# Patient Record
Sex: Male | Born: 1950
Health system: Southern US, Community
[De-identification: ages and names within clinical notes are randomized; demographics above are authoritative.]

## PROBLEM LIST (undated history)

## (undated) DIAGNOSIS — I10 Essential (primary) hypertension: Secondary | ICD-10-CM

## (undated) DIAGNOSIS — K219 Gastro-esophageal reflux disease without esophagitis: Secondary | ICD-10-CM

## (undated) DIAGNOSIS — J9859 Other diseases of mediastinum, not elsewhere classified: Secondary | ICD-10-CM

## (undated) DIAGNOSIS — I499 Cardiac arrhythmia, unspecified: Secondary | ICD-10-CM

## (undated) DIAGNOSIS — E785 Hyperlipidemia, unspecified: Secondary | ICD-10-CM

## (undated) DIAGNOSIS — E039 Hypothyroidism, unspecified: Secondary | ICD-10-CM

## (undated) DIAGNOSIS — M1990 Unspecified osteoarthritis, unspecified site: Secondary | ICD-10-CM

## (undated) HISTORY — PX: INNER EAR SURGERY: SHX679

## (undated) HISTORY — PX: COLONOSCOPY: SHX174

## (undated) HISTORY — DX: Essential (primary) hypertension: I10

## (undated) HISTORY — DX: Other diseases of mediastinum, not elsewhere classified: J98.59

## (undated) HISTORY — DX: Hyperlipidemia, unspecified: E78.5

## (undated) HISTORY — PX: HERNIA REPAIR: SHX51

## (undated) HISTORY — DX: Hypothyroidism, unspecified: E03.9

---

## 1998-05-06 ENCOUNTER — Ambulatory Visit (HOSPITAL_COMMUNITY): Admission: RE | Admit: 1998-05-06 | Discharge: 1998-05-06 | Payer: Self-pay | Admitting: Family Medicine

## 2003-05-08 ENCOUNTER — Encounter: Payer: Self-pay | Admitting: *Deleted

## 2003-05-08 ENCOUNTER — Ambulatory Visit (HOSPITAL_COMMUNITY): Admission: RE | Admit: 2003-05-08 | Discharge: 2003-05-08 | Payer: Self-pay | Admitting: *Deleted

## 2003-05-28 ENCOUNTER — Ambulatory Visit (HOSPITAL_COMMUNITY): Admission: RE | Admit: 2003-05-28 | Discharge: 2003-05-28 | Payer: Self-pay | Admitting: *Deleted

## 2004-07-21 ENCOUNTER — Ambulatory Visit (HOSPITAL_COMMUNITY): Admission: RE | Admit: 2004-07-21 | Discharge: 2004-07-21 | Payer: Self-pay | Admitting: Family Medicine

## 2006-05-25 ENCOUNTER — Ambulatory Visit (HOSPITAL_BASED_OUTPATIENT_CLINIC_OR_DEPARTMENT_OTHER): Admission: RE | Admit: 2006-05-25 | Discharge: 2006-05-25 | Payer: Self-pay | Admitting: General Surgery

## 2009-07-12 ENCOUNTER — Ambulatory Visit: Payer: Self-pay | Admitting: Internal Medicine

## 2009-07-26 ENCOUNTER — Encounter: Payer: Self-pay | Admitting: Internal Medicine

## 2009-07-26 ENCOUNTER — Ambulatory Visit: Payer: Self-pay | Admitting: Internal Medicine

## 2009-07-29 ENCOUNTER — Encounter: Payer: Self-pay | Admitting: Internal Medicine

## 2011-01-01 NOTE — Miscellaneous (Signed)
Summary: [facility]  Clinical Lists Changes  Medications: Added new medication of MOVIPREP 100 GM  SOLR (PEG-KCL-NACL-NASULF-NA ASC-C) As per prep instructions. - Signed Rx of MOVIPREP 100 GM  SOLR (PEG-KCL-NACL-NASULF-NA ASC-C) As per prep instructions.;  #1 x 0;  Signed;  Entered by: Ezra Sites RN;  Authorized by: Hilarie Fredrickson MD;  Method used: Electronically to [facility] [id number], [place], [place], [place]  [address], Ph: [phone] or [phone], Fax: [phone] Observations: Added new observation of NKA: T (07/12/2009 16:05)    Prescriptions: MOVIPREP 100 GM  SOLR (PEG-KCL-NACL-NASULF-NA ASC-C) As per prep instructions.  #1 x 0   Entered by:   Ezra Sites RN   Authorized by:   Hilarie Fredrickson MD   Signed by:   Ezra Sites RN on 07/12/2009   Method used:   Electronically to        [facility] [address] [phone]* (retail)       [address]       [place], [place]  [address]       Ph: [phone] or [phone]       Fax: [phone]   RxID:   [id number]

## 2011-01-01 NOTE — Letter (Signed)
Summary: Patient Notice- Polyp Results  [facility]  [address] [place], [place] [address]   Phone: [phone]  Fax: [phone]        July 29, 2009 MRN: [id number]    [facility] [address] [place], [place]  [address]    Dear Mario Hill,  I am pleased to inform you that the colon polyp(s) removed during your recent colonoscopy was (were) found to be benign (no cancer detected) upon pathologic examination.  I recommend you have a repeat colonoscopy examination in 10 years to look for recurrent polyps, as having colon polyps increases your risk for having recurrent polyps or even colon cancer in the future.  Should you develop new or worsening symptoms of abdominal pain, bowel habit changes or bleeding from the rectum or bowels, please schedule an evaluation with either your primary care physician or with me.  Additional information/recommendations:  __ No further action with gastroenterology is needed at this time. Please      follow-up with your primary care physician for your other healthcare      needs.   Please call us if you are having persistent problems or have questions about your condition that have not been fully answered at this time.  Sincerely,  Hilarie Fredrickson MD  This letter has been electronically signed by your physician.

## 2011-01-01 NOTE — Procedures (Signed)
Summary: Colonoscopy   Colonoscopy  Procedure date:  07/26/2009  Findings:      Location:  [facility].    Procedures Next Due Date:    Colonoscopy: 08/2019 COLONOSCOPY PROCEDURE REPORT  PATIENT:  Mario Hill, Mario Hill  MR#:  [id number] BIRTHDATE:   [birth year], 60 yrs. old   GENDER:   male  ENDOSCOPIST:   Cato Liburd. Eda Keys, MD Referred by: Kari Baars, M.D.  PROCEDURE DATE:  07/26/2009 PROCEDURE:  Colonoscopy with snare polypectomy ASA CLASS:   Class II INDICATIONS: Routine Risk Screening   MEDICATIONS:    Fentanyl 75 mcg IV, Versed 7 mg IV  DESCRIPTION OF PROCEDURE:   After the risks benefits and alternatives of the procedure were thoroughly explained, informed consent was obtained.  Digital rectal exam was performed and revealed no abnormalities.   The LB CF-[id number] endoscope was introduced through the anus and advanced to the cecum, which was identified by both the appendix and ileocecal valve, without limitations.TIME TO CECUM = 1:53 MIN.  The quality of the prep was excellent, using MoviPrep.  The instrument was then withdrawn (TIME = 14:22  MIN.) as the colon was fully examined. <<PROCEDUREIMAGES>>          <<OLD IMAGES>>  FINDINGS:  A 3mm diminutive polyp was found in the rectum. Polyp was snared without cautery. Retrieval was successful.  Mild diverticulosis was found in the sigmoid colon.  This was otherwise a normal examination of the colon.   Retroflexed views in the rectum revealed internal hemorrhoids.    The scope was then withdrawn from the patient and the procedure completed.  COMPLICATIONS:   None  ENDOSCOPIC IMPRESSION:  1) Diminutive polyp in the rectum -Removed  2) Mild diverticulosis in the sigmoid colon  3) Otherwise normal examination  4) Internal hemorrhoids   RECOMMENDATIONS:  1) Repeat colonoscopy in 5 years if polyp adenomatous; otherwise 10 years    _______________________________ Wilhemina Bonito. Eda Keys, MD  CC: Eric Form,  MD;  The Patient     REPORT OF SURGICAL PATHOLOGY   Case #: [id number] Patient Name: RAUN, ROUTH. Office Chart Number:  N/A [id number] MRN: [id number] Pathologist: Ferd Hibbs. Colonel Bald, MD DOB/Age  02-12-1951 (Age: 26)    Gender: M Date Taken:  07/26/2009 Date Received: 07/26/2009   FINAL DIAGNOSIS   ***MICROSCOPIC EXAMINATION AND DIAGNOSIS***   RECTUM, BIOPSY:   -  HYPERPLASTIC POLYP. -  THERE IS NO EVIDENCE OF MALIGNANCY.   kv Date Reported:  07/29/2009     Ivin Booty B. Colonel Bald, MD *** Electronically Signed Out By [facility] ***   July 29, 2009 MRN: [id number]    [facility] [address] [place], [place]  [address]    Dear Mr. MOLDEN,  I am pleased to inform you that the colon polyp(s) removed during your recent colonoscopy was (were) found to be benign (no cancer detected) upon pathologic examination.  I recommend you have a repeat colonoscopy examination in 10 years to look for recurrent polyps, as having colon polyps increases your risk for having recurrent polyps or even colon cancer in the future.  Should you develop new or worsening symptoms of abdominal pain, bowel habit changes or bleeding from the rectum or bowels, please schedule an evaluation with either your primary care physician or with me.  Additional information/recommendations:  __ No further action with gastroenterology is needed at this time. Please      follow-up with your primary care physician for your other healthcare  needs.   Please call us if you are having persistent problems or have questions about your condition that have not been fully answered at this time.  Sincerely,  Hilarie Fredrickson MD  This letter has been electronically signed by your physician.   Signed by Hilarie Fredrickson MD on 07/29/2009 at 4:37 PM  This report was created from the original endoscopy report, which was reviewed and signed by the above listed endoscopist.

## 2011-02-01 ENCOUNTER — Inpatient Hospital Stay (INDEPENDENT_AMBULATORY_CARE_PROVIDER_SITE_OTHER)
Admission: RE | Admit: 2011-02-01 | Discharge: 2011-02-01 | Disposition: A | Discharge status: Home / Self Care | Payer: Managed Care, Other (non HMO) | Source: Ambulatory Visit | Attending: Emergency Medicine | Admitting: Emergency Medicine

## 2011-02-01 DIAGNOSIS — W540XXA Bitten by dog, initial encounter: Secondary | ICD-10-CM

## 2011-02-01 DIAGNOSIS — S61409A Unspecified open wound of unspecified hand, initial encounter: Secondary | ICD-10-CM

## 2011-02-01 DIAGNOSIS — Z23 Encounter for immunization: Secondary | ICD-10-CM

## 2011-02-03 ENCOUNTER — Inpatient Hospital Stay (HOSPITAL_COMMUNITY)
Admission: RE | Admit: 2011-02-03 | Discharge: 2011-02-03 | Disposition: A | Discharge status: Home / Self Care | Payer: Managed Care, Other (non HMO) | Source: Ambulatory Visit | Attending: Family Medicine | Admitting: Family Medicine

## 2011-02-11 ENCOUNTER — Inpatient Hospital Stay (HOSPITAL_COMMUNITY)
Admission: RE | Admit: 2011-02-11 | Discharge: 2011-02-11 | Disposition: A | Discharge status: Home / Self Care | Payer: Managed Care, Other (non HMO) | Source: Ambulatory Visit | Attending: Family Medicine | Admitting: Family Medicine

## 2011-04-17 NOTE — Op Note (Signed)
NAME:  PSALM, SCHAPPELL NO.:  [id number]   MEDICAL RECORD NO.:  [id number]          PATIENT TYPE:  AMB   LOCATION:  [facility]                          FACILITY:  [facility]   PHYSICIAN:  Gabrielle Dare. Janee Morn, M.D.DATE OF BIRTH:  [birth year]   DATE OF PROCEDURE:  05/25/2006  DATE OF DISCHARGE:                                 OPERATIVE REPORT   PREOPERATIVE DIAGNOSIS:  Left inguinal hernia.   POSTOPERATIVE DIAGNOSIS:  Left inguinal hernia.   PROCEDURE:  Repair of left inguinal hernia with mesh.   SURGEON:  Gabrielle Dare. Janee Morn, M.D.   ANESTHESIA:  General.   INDICATIONS FOR PROCEDURE:  The patient is a 60 year old gentleman who I saw  in the office for a symptomatic left inguinal hernia.  Of note, he is status  post right inguinal hernia repair with mesh approximately 10 years ago.  He  presents today for elective left inguinal hernia repair with mesh.   DESCRIPTION OF PROCEDURE:  Informed consent was obtained.  The patient's  site was marked.  He received intravenous antibiotics.  He was brought to  the operating room, and general anesthesia was administered.  His left groin  and abdomen were prepped and draped in a sterile fashion. Marcaine 0.25%  with epinephrine was injected in the left inguinal region and also up  towards the anterior superior iliac spine for some postoperative pain relief  nerve block.   A left inguinal incision was made.  The subcutaneous tissues were dissected  down through Scarpa's fascia, revealing the external oblique fascia.  This  was divided sharply down through the external ring.  The superior leaflet of  the external oblique was dissected off of the transversalis.  The inferior  leaflet was dissected bluntly from the cord structures, revealing the  shelving edge of the inguinal ligament.  The cord structures were encircled  with a Penrose drain.  The inguinal floor was inspected.  There was no  evidence of direct hernia.  The cord was then  dissected, revealing a  moderately sized indirect hernia sac.  This was dissected free from the cord  structures, and then the sac was opened.  It contained omentum only.  The  omentum was reduced back into the abdomen, and the sac was high-ligated with  a 2-0 Vicryl suture and removed.  Completion of the repair was then done  with a polypropylene mesh that was fashioned into a keyhole shape using our  standard template.  The mesh was sutured to the tissues over the pubic  tubercle medially with a 2-0 Prolene and then sutured in a running fashion  along the shelving edge of the inguinal ligament inferiorly with a 2-0  Prolene superiorly and over the inguinal ligament.  The mesh was again  tacked in an interrupted fashion with several 2-0 Prolene sutures.  This  continued along, tacking the mesh to the transversalis with interrupted 2-0  Prolene sutures.  The two leaflets of the mesh were rejoined behind the cord  and tacked together and to the underlying musculature with several  interrupted 2-0 Prolene sutures.  The aperture for the cord through the mesh  admitted the tip of a fifth digit, and the cord was viable and not  edematous.  At this time, the wound was copiously irrigated.  Meticulous  hemostasis was insured.  Some additional local anesthetic was injected.  We  then closed the external oblique, being careful again to protect the  ilioinguinal nerve, using running 2-0 Vicryl suture.  The subcutaneous  tissues were irrigated.  Hemostasis was again insured, and Scarpa's fascia  was approximated with interrupted 3-0 Vicryl sutures, and the skin was  closed with a running 4-0 Monocryl subcuticular stitch.  Sponge, needle, and  instrument counts were correct.  Benzoin, Steri-Strips, and sterile  dressings were applied.  The patient's left testicle was returned to normal  position in his scrotum, and this completed the procedure.   He tolerated the procedure well without apparent  complication and was taken  to the recovery room in stable condition.      Gabrielle Dare Janee Morn, M.D.  Electronically Signed     BET/MEDQ  D:  05/25/2006  T:  05/25/2006  Job:  [id number]   cc:   Martha Clan, M.D.

## 2014-08-10 ENCOUNTER — Encounter: Payer: Self-pay | Admitting: Internal Medicine

## 2014-10-19 ENCOUNTER — Other Ambulatory Visit: Payer: Self-pay | Admitting: Otolaryngology

## 2015-10-14 ENCOUNTER — Ambulatory Visit (INDEPENDENT_AMBULATORY_CARE_PROVIDER_SITE_OTHER): Payer: BLUE CROSS/BLUE SHIELD | Admitting: Licensed Clinical Social Worker

## 2015-10-14 DIAGNOSIS — F4320 Adjustment disorder, unspecified: Secondary | ICD-10-CM | POA: Diagnosis not present

## 2015-10-29 ENCOUNTER — Ambulatory Visit (INDEPENDENT_AMBULATORY_CARE_PROVIDER_SITE_OTHER): Payer: BLUE CROSS/BLUE SHIELD | Admitting: Licensed Clinical Social Worker

## 2015-10-29 DIAGNOSIS — F4320 Adjustment disorder, unspecified: Secondary | ICD-10-CM

## 2016-05-12 ENCOUNTER — Ambulatory Visit (INDEPENDENT_AMBULATORY_CARE_PROVIDER_SITE_OTHER): Payer: BLUE CROSS/BLUE SHIELD | Admitting: Licensed Clinical Social Worker

## 2016-05-12 DIAGNOSIS — F329 Major depressive disorder, single episode, unspecified: Secondary | ICD-10-CM

## 2016-07-27 ENCOUNTER — Ambulatory Visit (INDEPENDENT_AMBULATORY_CARE_PROVIDER_SITE_OTHER): Payer: BLUE CROSS/BLUE SHIELD | Admitting: Licensed Clinical Social Worker

## 2016-07-27 DIAGNOSIS — F329 Major depressive disorder, single episode, unspecified: Secondary | ICD-10-CM | POA: Diagnosis not present

## 2016-09-21 ENCOUNTER — Ambulatory Visit: Payer: BLUE CROSS/BLUE SHIELD | Admitting: Licensed Clinical Social Worker

## 2017-08-04 DIAGNOSIS — I10 Essential (primary) hypertension: Secondary | ICD-10-CM | POA: Diagnosis not present

## 2017-08-04 DIAGNOSIS — R8299 Other abnormal findings in urine: Secondary | ICD-10-CM | POA: Diagnosis not present

## 2017-08-04 DIAGNOSIS — Z125 Encounter for screening for malignant neoplasm of prostate: Secondary | ICD-10-CM | POA: Diagnosis not present

## 2017-08-04 DIAGNOSIS — E785 Hyperlipidemia, unspecified: Secondary | ICD-10-CM | POA: Diagnosis not present

## 2017-08-04 DIAGNOSIS — R7301 Impaired fasting glucose: Secondary | ICD-10-CM | POA: Diagnosis not present

## 2017-08-04 DIAGNOSIS — E038 Other specified hypothyroidism: Secondary | ICD-10-CM | POA: Diagnosis not present

## 2017-08-09 ENCOUNTER — Other Ambulatory Visit: Payer: Self-pay | Admitting: Internal Medicine

## 2017-08-09 DIAGNOSIS — Z0000 Encounter for general adult medical examination without abnormal findings: Secondary | ICD-10-CM | POA: Diagnosis not present

## 2017-08-09 DIAGNOSIS — E669 Obesity, unspecified: Secondary | ICD-10-CM | POA: Diagnosis not present

## 2017-08-09 DIAGNOSIS — F17201 Nicotine dependence, unspecified, in remission: Secondary | ICD-10-CM

## 2017-08-09 DIAGNOSIS — K219 Gastro-esophageal reflux disease without esophagitis: Secondary | ICD-10-CM | POA: Diagnosis not present

## 2017-08-09 DIAGNOSIS — Z136 Encounter for screening for cardiovascular disorders: Secondary | ICD-10-CM

## 2017-08-09 DIAGNOSIS — Z6832 Body mass index (BMI) 32.0-32.9, adult: Secondary | ICD-10-CM | POA: Diagnosis not present

## 2017-08-09 DIAGNOSIS — E038 Other specified hypothyroidism: Secondary | ICD-10-CM | POA: Diagnosis not present

## 2017-08-09 DIAGNOSIS — R69 Illness, unspecified: Secondary | ICD-10-CM | POA: Diagnosis not present

## 2017-08-09 DIAGNOSIS — R7301 Impaired fasting glucose: Secondary | ICD-10-CM | POA: Diagnosis not present

## 2017-08-09 DIAGNOSIS — I10 Essential (primary) hypertension: Secondary | ICD-10-CM | POA: Diagnosis not present

## 2017-08-09 DIAGNOSIS — E784 Other hyperlipidemia: Secondary | ICD-10-CM | POA: Diagnosis not present

## 2017-08-09 DIAGNOSIS — R740 Nonspecific elevation of levels of transaminase and lactic acid dehydrogenase [LDH]: Secondary | ICD-10-CM | POA: Diagnosis not present

## 2017-08-13 DIAGNOSIS — Z1212 Encounter for screening for malignant neoplasm of rectum: Secondary | ICD-10-CM | POA: Diagnosis not present

## 2017-08-20 ENCOUNTER — Ambulatory Visit
Admission: RE | Admit: 2017-08-20 | Discharge: 2017-08-20 | Disposition: A | Discharge status: Home / Self Care | Payer: Self-pay | Source: Ambulatory Visit | Attending: Internal Medicine | Admitting: Internal Medicine

## 2017-08-20 DIAGNOSIS — Z136 Encounter for screening for cardiovascular disorders: Secondary | ICD-10-CM | POA: Diagnosis not present

## 2017-08-20 DIAGNOSIS — Z87891 Personal history of nicotine dependence: Secondary | ICD-10-CM | POA: Diagnosis not present

## 2017-08-20 DIAGNOSIS — R69 Illness, unspecified: Secondary | ICD-10-CM | POA: Diagnosis not present

## 2017-08-20 DIAGNOSIS — F17201 Nicotine dependence, unspecified, in remission: Secondary | ICD-10-CM

## 2017-08-25 ENCOUNTER — Institutional Professional Consult (permissible substitution) (INDEPENDENT_AMBULATORY_CARE_PROVIDER_SITE_OTHER): Payer: Medicare HMO | Admitting: Thoracic Surgery (Cardiothoracic Vascular Surgery)

## 2017-08-25 ENCOUNTER — Encounter: Payer: Self-pay | Admitting: Thoracic Surgery (Cardiothoracic Vascular Surgery)

## 2017-08-25 VITALS — BP 134/84 | HR 77 | Resp 18 | Ht 68.0 in | Wt 220.0 lb

## 2017-08-25 DIAGNOSIS — E785 Hyperlipidemia, unspecified: Secondary | ICD-10-CM | POA: Diagnosis not present

## 2017-08-25 DIAGNOSIS — M1990 Unspecified osteoarthritis, unspecified site: Secondary | ICD-10-CM

## 2017-08-25 DIAGNOSIS — K219 Gastro-esophageal reflux disease without esophagitis: Secondary | ICD-10-CM

## 2017-08-25 DIAGNOSIS — I10 Essential (primary) hypertension: Secondary | ICD-10-CM | POA: Diagnosis not present

## 2017-08-25 DIAGNOSIS — J9859 Other diseases of mediastinum, not elsewhere classified: Secondary | ICD-10-CM

## 2017-08-25 NOTE — Progress Notes (Signed)
PCP is Marton Redwood, MD Referring Provider is Marton Redwood, MD  Chief Complaint  Patient presents with  . Mediastinal Mass    eval for mediastinal mass, CT chest done 9/21    HPI: Mario Hill is sent for consultation re: an anterior mediastinal mass  Mario Hill is a 66 year old gentleman with a past history of tobacco abuse (quit in 2006), hypertension, hyperlipidemia, hypothyroidism. He recently saw Dr. Brigitte Pulse. He met criteria for low dose CT screening for lung cancer. He had a low-dose scan last week and it showed an anterior mediastinal mass.  Sometimes gets short of breath and fatigue with heavy exertion but not with normal activities. He denies any chest pain, pressure, or tightness. He has no orthopnea, paroxysmal nocturnal dyspnea, or peripheral edema. His appetite is good and his weight is stable. He denies double vision and difficulty swallowing.  Zubrod Score: At the time of surgery this patient's most appropriate activity status/level should be described as: '[]'     0    Normal activity, no symptoms '[]'     1    Restricted in physical strenuous activity but ambulatory, able to do out light work '[]'     2    Ambulatory and capable of self care, unable to do work activities, up and about >50 % of waking hours                              '[]'     3    Only limited self care, in bed greater than 50% of waking hours '[]'     4    Completely disabled, no self care, confined to bed or chair '[]'     5    Moribund  PAST MEDICAL HISTORY  Hypertension Hyperlipidemia Hypothyroidism Tobacco abuse (quit 2006)  PAST SURGICAL HISTORY   Ear surgery Bilateral hernia repairs   FAMILY HISTORY  Father- lung or kidney cancer Mother- heart disease Grandfather and grandmother- heart disease  Social History Social History  Substance Use Topics  . Smoking status: Former Smoker    Packs/day: 1.00    Types: Cigarettes    Quit date: 11/30/2004  . Smokeless tobacco: Never Used  . Alcohol use Yes     Current Outpatient Prescriptions  Medication Sig Dispense Refill  . atorvastatin (LIPITOR) 40 MG tablet Take 40 mg by mouth at bedtime.  3  . levothyroxine (SYNTHROID, LEVOTHROID) 50 MCG tablet Take 50 mcg by mouth daily.  6  . lisinopril (PRINIVIL,ZESTRIL) 20 MG tablet Take 20 mg by mouth daily.  6  . omeprazole (PRILOSEC) 20 MG capsule Take 20 mg by mouth daily.  3  . sertraline (ZOLOFT) 50 MG tablet Take 50 mg by mouth daily.  5   No current facility-administered medications for this visit.     Not on File  Review of Systems  Constitutional: Negative for activity change, appetite change, fatigue, fever and unexpected weight change.  HENT: Negative for trouble swallowing and voice change.   Eyes: Negative for visual disturbance.  Respiratory: Positive for shortness of breath (With heavy exertion). Negative for cough and wheezing.   Gastrointestinal: Positive for abdominal pain (Reflux). Negative for constipation and diarrhea.  Genitourinary: Negative for difficulty urinating and dysuria.  Musculoskeletal: Positive for arthralgias and joint swelling.  Neurological: Negative for dizziness, facial asymmetry and headaches.  Hematological: Negative for adenopathy. Does not bruise/bleed easily.  All other systems reviewed and are negative.   BP 134/84 (BP Location:  Right Arm, Cuff Size: Normal)   Pulse 77   Resp 18   Ht '5\' 8"'  (1.727 m)   Wt 220 lb (99.8 kg)   SpO2 92% Comment: on RA  BMI 33.45 kg/m  Physical Exam  Constitutional: He is oriented to person, place, and time. He appears well-developed and well-nourished. No distress.  HENT:  Head: Normocephalic and atraumatic.  Mouth/Throat: No oropharyngeal exudate.  Eyes: Pupils are equal, round, and reactive to light. Conjunctivae and EOM are normal. No scleral icterus.  Neck: Neck supple. No thyromegaly present.  Cardiovascular: Normal rate, regular rhythm, normal heart sounds and intact distal pulses.  Exam reveals no  gallop and no friction rub.   No murmur heard. Pulmonary/Chest: Effort normal and breath sounds normal. No respiratory distress. He has no wheezes. He has no rales.  Abdominal: Soft. He exhibits no distension. There is no tenderness.  Musculoskeletal: He exhibits no edema or deformity.  Lymphadenopathy:    He has no cervical adenopathy.  Neurological: He is alert and oriented to person, place, and time. No cranial nerve deficit.  Motor function grossly intact  Skin: Skin is warm and dry.  Vitals reviewed.    Diagnostic Tests: CT CHEST WITHOUT CONTRAST LOW-DOSE FOR LUNG CANCER SCREENING  TECHNIQUE: Multidetector CT imaging of the chest was performed following the standard protocol without IV contrast.  COMPARISON:  None.  FINDINGS: Cardiovascular: The heart is normal in size. No pericardial effusion.  No evidence of thoracic aortic aneurysm. Mild atherosclerotic calcifications of the aortic arch.  Three vessel coronary atherosclerosis.  Mediastinum/Nodes: 2.3 x 1.9 x 2.9 cm triangular prevascular soft tissue lesion (series 2/ image 22), favoring a thymoma, less likely an enlarged prevascular lymph node.  Otherwise, no suspicious mediastinal lymphadenopathy.  Visualized thyroid is unremarkable.  Lungs/Pleura: Mild centrilobular emphysematous changes, upper lobe predominant.  No focal consolidation.  2.6 mm subpleural granuloma in the posterior right lower lobe, benign.  No pleural effusion or pneumothorax.  Upper Abdomen: Visualized upper abdomen is notable for mild hepatic steatosis.  Musculoskeletal: Mild degenerative changes of the visualized thoracolumbar spine.  IMPRESSION: Lung-RADS 1S, negative. Continue annual screening with low-dose chest CT without contrast in 12 months.  A Lung-RADS "S" modifier is used due to the presence of a potentially clinically significant finding, in this case, 2.9 cm triangular prevascular soft tissue  lesion, favoring a thymoma, less likely an enlarged prevascular lymph node. Thoracic surgery consultation is suggested.  Aortic Atherosclerosis (ICD10-I70.0) and Emphysema (ICD10-J43.9).   Electronically Signed   By: Julian Hy M.D.   On: 08/20/2017 13:24 I personally reviewed the CT images and concur with the findings noted above  Impression: Mario Hill is a 66 year old man with a past history of tobacco abuse, hypertension, hyperlipidemia. He recently had a low-dose screening CT scan. It showed no evidence of lung cancer but did show an anterior mediastinal mass that officially measures 2.3 x 1.9 x 2.9 cm. (On the coronal views I measured it at 4 cm). Differential diagnosis includes thymoma, lymphoma, germ cell tumor, and ectopic thyroid. Of these thymoma is the most likely. The mass appears relatively well encapsulated and surrounded by fatty tissue, there is no evidence of invasion of surrounding structures. He does not have any myasthenia symptoms.  I discussed the options of surgical resection versus radiographic follow-up with Mario Hill. I would favor surgical resection given the size of this lesion. We discussed approaching the lesion through a sternotomy or via a video-assisted thoracoscopic approach. This mass  does appear to be approachable via a VATS.  I described the general nature of the proposed operation which is right VATS for resection of anterior mediastinal mass. He understands there is a possibility we could have to go into the left chest as well. I informed them of the indications, risks, benefits, and alternatives. They understand the risks include, but are not limited to death, MI, DVT, PE, bleeding, possible need for transfusion, infection, phrenic nerve injury leading to paralysis of the diaphragm, as well as the possibility of other unforeseeable complications.  He is in favor of surgical resection, but asked if he could wait until after the first of the  year. There is no urgency, but I am a little concerned about waiting that long. He would need another CT scan if he decides to wait until after the first of the year as it would be more than 3 months since his previous scan.  Coronary atherosclerosis- noted on CT. I discussed this finding with Mario Hill. They understand this is a common finding in people in his age group. He does not have any anginal symptoms. He is on a statin.  Aortic atherosclerosis- also noted on CT scan. Appears mild.  Plan: Right VATS for resection of anterior mediastinal mass with possible left VATS.  Patient will call to schedule surgery.  Melrose Nakayama, MD [facility] [phone]

## 2017-12-06 ENCOUNTER — Other Ambulatory Visit: Payer: Self-pay | Admitting: Thoracic Surgery (Cardiothoracic Vascular Surgery)

## 2017-12-06 DIAGNOSIS — J9859 Other diseases of mediastinum, not elsewhere classified: Secondary | ICD-10-CM

## 2018-01-04 ENCOUNTER — Ambulatory Visit
Admission: RE | Admit: 2018-01-04 | Discharge: 2018-01-04 | Disposition: A | Discharge status: Home / Self Care | Payer: PPO | Source: Ambulatory Visit | Attending: Thoracic Surgery (Cardiothoracic Vascular Surgery) | Admitting: Thoracic Surgery (Cardiothoracic Vascular Surgery)

## 2018-01-04 ENCOUNTER — Other Ambulatory Visit: Payer: Self-pay

## 2018-01-04 ENCOUNTER — Ambulatory Visit: Payer: Medicare HMO | Admitting: Thoracic Surgery (Cardiothoracic Vascular Surgery)

## 2018-01-04 DIAGNOSIS — J9859 Other diseases of mediastinum, not elsewhere classified: Secondary | ICD-10-CM

## 2018-01-04 DIAGNOSIS — R911 Solitary pulmonary nodule: Secondary | ICD-10-CM | POA: Diagnosis not present

## 2018-01-07 ENCOUNTER — Other Ambulatory Visit: Payer: Self-pay

## 2018-01-07 ENCOUNTER — Ambulatory Visit: Payer: PPO | Admitting: Thoracic Surgery (Cardiothoracic Vascular Surgery)

## 2018-01-07 ENCOUNTER — Encounter: Payer: Self-pay | Admitting: Thoracic Surgery (Cardiothoracic Vascular Surgery)

## 2018-01-07 DIAGNOSIS — J9859 Other diseases of mediastinum, not elsewhere classified: Secondary | ICD-10-CM

## 2018-01-08 ENCOUNTER — Encounter: Payer: Self-pay | Admitting: Thoracic Surgery (Cardiothoracic Vascular Surgery)

## 2018-01-08 DIAGNOSIS — J9859 Other diseases of mediastinum, not elsewhere classified: Secondary | ICD-10-CM | POA: Insufficient documentation

## 2018-01-08 NOTE — Progress Notes (Signed)
[address]       [place] [id number]             [phone]       HPI: Mr. Mario Hill returns to discuss treatment of his anterior mediastinal mass  Mr. Carmie KannerBotts is a 67 year old man with a past history of tobacco abuse, hypertension, hyperlipidemia, hypothyroidism, and coronary and aortic atherosclerosis on CT.  He had a low-dose CT scan for screening for lung cancer last fall.  He was found to have an anterior mediastinal mass on that scan.  I saw him in September and recommended thymectomy.  Personal reasons he wanted to wait until after the first of the year to have surgery.  I recommended that we do another CT given the delay.  He has not had any new issues in the interim since his last visit.  He gets short of breath with heavy exertion but not with any type of routine activities.  He is not having any chest pain, pressure, or tightness.  He does not have any double vision or weakness.  Past medical history Tobacco abuse-quit 2006 Hypertension Hyperlipidemia Hypothyroidism Atherosclerotic cardiovascular disease    Current Outpatient Medications  Medication Sig Dispense Refill  . atorvastatin (LIPITOR) 40 MG tablet Take 40 mg by mouth at bedtime.  3  . levothyroxine (SYNTHROID, LEVOTHROID) 50 MCG tablet Take 50 mcg by mouth daily.  6  . lisinopril (PRINIVIL,ZESTRIL) 20 MG tablet Take 20 mg by mouth daily.  6  . omeprazole (PRILOSEC) 20 MG capsule Take 20 mg by mouth daily.  3  . sertraline (ZOLOFT) 50 MG tablet Take 50 mg by mouth daily.  5   No current facility-administered medications for this visit.     Physical Exam BP 128/79 (BP Location: Left Arm, Patient Position: Sitting, Cuff Size: Large)   Pulse 72   Resp 18   Ht 5\' 8"  (1.727 m)   Wt 219 lb 12.8 oz (99.7 kg)   SpO2 98% Comment: RA  BMI 33.8042 kg/m  67 year old man in no acute distress Alert and oriented x3 with no focal deficits Good strength in all 4 extremities HEENT unremarkable Neck  supple without thyromegaly or adenopathy Lungs clear with equal breath sounds bilaterally Cardiac regular rate and rhythm normal S1 and S2 Abdomen soft and nontender Extremities no clubbing cyanosis or edema  Diagnostic Tests: CT CHEST WITHOUT CONTRAST  TECHNIQUE: Multidetector CT imaging of the chest was performed following the standard protocol without IV contrast.  COMPARISON:  08/20/2017  FINDINGS: Cardiovascular: No acute findings. Aortic and coronary artery atherosclerosis.  Mediastinum/Nodes: An anterior mediastinal mass measures 2.2 x 1.8 cm, without significant change compared to previous study. This mass shows no evidence of internal fat or calcification. No other soft tissue masses or lymphadenopathy identified.  Lungs/Pleura: Mild centrilobular emphysema noted. No pulmonary infiltrate or mass identified. No effusion present.  Upper Abdomen:  Unremarkable.  Musculoskeletal:  No suspicious bone lesions.  IMPRESSION: Stable 2.2 cm nonspecific anterior mediastinal mass. This is suspicious for thymoma, with differential diagnosis including lymphoma and less likely germ cell tumor.  Mild centrilobular emphysema.  No active lung disease.  Aortic Atherosclerosis (ICD10-I70.0). Coronary artery calcification.   Electronically Signed   By: Myles RosenthalJohn  Stahl M.D.   On: 01/04/2018 12:48 I personally reviewed the CT images and concur with the findings noted above.  Impression: Mr. Mario Hill is a 67 year old gentleman with a small stable anterior mediastinal mass.  This is most likely a  thymoma, but germ cell tumors and lymphoma are also in the differential.  This is been relatively stable over time.  I still think he would be best served with a thymectomy is over time this could continue to grow and potentially invade surrounding structures.  Continued radiographic follow-up is an option but I would not favor that.  I reviewed the CT images with Mr. Mrs. Arko.  We  discussed the differential diagnosis.  We talked about surgical approaches including partial sternotomy and VATS.  He strongly prefers VATS.  He does understand there is a possibility that with a right VATS approach we may have to go into the left chest as well.  We reviewed the general nature of the operation including the need for general anesthesia and the incisions to be used.  I informed him of the indications, risks, benefits, and alternatives.  He understands the risks include but are not limited to death, MI, DVT, PE, bleeding possible need for transfusion, infection, phrenic or recurrent nerve injury, as well as possibility of other unforeseeable complications.  He wishes with surgical resection in the next 1-2 months, but has some family issues that need considered.  He will call to schedule surgery.  Plan: Right VATS, possible left VATS for thymectomy.  Patient will call to schedule  Loreli Slot, MD [facility] [phone]

## 2018-02-17 ENCOUNTER — Other Ambulatory Visit: Payer: Self-pay | Admitting: *Deleted

## 2018-02-17 DIAGNOSIS — J9859 Other diseases of mediastinum, not elsewhere classified: Secondary | ICD-10-CM

## 2018-03-11 NOTE — Pre-Procedure Instructions (Signed)
Edward JollyJohn S Koors  03/11/2018      [id number] [place] - [address] [facility] [id number] [place] [id number] Phone: [phone] Fax: [phone]    Your procedure is scheduled on April 17.  [facility] at [phone]   Call this number if you have problems the morning of surgery:  [phone]   Remember:  Do not eat food or drink liquids after midnight.  Take these medicines the morning of surgery with A SIP OF WATER Tylenol if needed, levothyroxine (Synthroid), Omeprazole (Prilosec), Sertraline (Zoloft)   Stop taking aspirin, BC's, Goody's, Herbal medications, Fish Oil, Aleve, Ibuprofen, Advil, Motrin, Vitamins   Do not wear jewelry, make-up or nail polish.  Do not wear lotions, powders, or perfumes, or deodorant.  Do not shave 48 hours prior to surgery.  Men may shave face and neck.  Do not bring valuables to the hospital.  [facility] is not responsible for any belongings or valuables.  Contacts, dentures or bridgework may not be worn into surgery.  Leave your suitcase in the car.  After surgery it may be brought to your room.  For patients admitted to the hospital, discharge time will be determined by your treatment team.  Patients discharged the day of surgery will not be allowed to drive home.  * Special instructions:  [place] - Preparing for Surgery  Before surgery, you can play an important role.  Because skin is not sterile, your skin needs to be as free of germs as possible.  You can reduce the number of germs on you skin by washing with CHG (chlorahexidine gluconate) soap before surgery.  CHG is an antiseptic cleaner which kills germs and bonds with the skin to continue killing germs even after washing.  Please DO NOT use if you have an allergy to CHG or antibacterial soaps.  If your skin becomes reddened/irritated stop using the CHG and inform your nurse when you arrive at  Short Stay.  Do not shave (including legs and underarms) for at least 48 hours prior to the first CHG shower.  You may shave your face.  Please follow these instructions carefully:   1.  Shower with CHG Soap the night before surgery and the   morning of Surgery.  2.  If you choose to wash your hair, wash your hair first as usual with your  normal shampoo.  3.  After you shampoo, rinse your hair and body thoroughly to remove the Shampoo.  4.  Use CHG as you would any other liquid soap.  You can apply chg directly to the skin and wash gently with scrungie or a clean washcloth.  5.  Apply the CHG Soap to your body ONLY FROM THE NECK DOWN.   Do not use on open wounds or open sores.  Avoid contact with your eyes,  ears, mouth and genitals (private parts).  Wash genitals (private parts)  with your normal soap.  6.  Wash thoroughly, paying special attention to the area where your surgery will be performed.  7.  Thoroughly rinse your body with warm water from the neck down.  8.  DO NOT shower/wash with your normal soap after using and rinsing off  the CHG Soap.  9.  Pat yourself dry with a clean towel.            10.  Wear clean pajamas.  11.  Place clean sheets on your bed the night of your first shower and do not sleep with pets.  Day of Surgery  Do not apply any lotions/deoderants the morning of surgery.  Please wear clean clothes to the hospital/surgery center.     Please read over the following fact sheets that you were given. Pain Booklet, Coughing and Deep Breathing, MRSA Information and Surgical Site Infection Prevention

## 2018-03-14 ENCOUNTER — Encounter (HOSPITAL_COMMUNITY)
Admission: RE | Admit: 2018-03-14 | Discharge: 2018-03-14 | Disposition: A | Discharge status: Home / Self Care | Payer: PPO | Source: Ambulatory Visit | Attending: Thoracic Surgery (Cardiothoracic Vascular Surgery) | Admitting: Thoracic Surgery (Cardiothoracic Vascular Surgery)

## 2018-03-14 ENCOUNTER — Encounter (HOSPITAL_COMMUNITY): Payer: Self-pay

## 2018-03-14 ENCOUNTER — Ambulatory Visit (HOSPITAL_COMMUNITY)
Admission: RE | Admit: 2018-03-14 | Discharge: 2018-03-14 | Disposition: A | Discharge status: Home / Self Care | Payer: PPO | Source: Ambulatory Visit | Attending: Thoracic Surgery (Cardiothoracic Vascular Surgery) | Admitting: Thoracic Surgery (Cardiothoracic Vascular Surgery)

## 2018-03-14 ENCOUNTER — Other Ambulatory Visit: Payer: Self-pay

## 2018-03-14 DIAGNOSIS — K219 Gastro-esophageal reflux disease without esophagitis: Secondary | ICD-10-CM | POA: Diagnosis present

## 2018-03-14 DIAGNOSIS — E039 Hypothyroidism, unspecified: Secondary | ICD-10-CM | POA: Diagnosis present

## 2018-03-14 DIAGNOSIS — I700 Atherosclerosis of aorta: Secondary | ICD-10-CM | POA: Diagnosis present

## 2018-03-14 DIAGNOSIS — M1990 Unspecified osteoarthritis, unspecified site: Secondary | ICD-10-CM | POA: Diagnosis not present

## 2018-03-14 DIAGNOSIS — J9859 Other diseases of mediastinum, not elsewhere classified: Secondary | ICD-10-CM

## 2018-03-14 DIAGNOSIS — I2584 Coronary atherosclerosis due to calcified coronary lesion: Secondary | ICD-10-CM | POA: Insufficient documentation

## 2018-03-14 DIAGNOSIS — I459 Conduction disorder, unspecified: Secondary | ICD-10-CM

## 2018-03-14 DIAGNOSIS — R222 Localized swelling, mass and lump, trunk: Secondary | ICD-10-CM

## 2018-03-14 DIAGNOSIS — Z01810 Encounter for preprocedural cardiovascular examination: Secondary | ICD-10-CM | POA: Insufficient documentation

## 2018-03-14 DIAGNOSIS — Z01818 Encounter for other preprocedural examination: Secondary | ICD-10-CM | POA: Insufficient documentation

## 2018-03-14 DIAGNOSIS — I10 Essential (primary) hypertension: Secondary | ICD-10-CM

## 2018-03-14 DIAGNOSIS — Z01812 Encounter for preprocedural laboratory examination: Secondary | ICD-10-CM | POA: Insufficient documentation

## 2018-03-14 DIAGNOSIS — E328 Other diseases of thymus: Secondary | ICD-10-CM | POA: Diagnosis present

## 2018-03-14 DIAGNOSIS — E785 Hyperlipidemia, unspecified: Secondary | ICD-10-CM | POA: Diagnosis present

## 2018-03-14 DIAGNOSIS — I2510 Atherosclerotic heart disease of native coronary artery without angina pectoris: Secondary | ICD-10-CM | POA: Diagnosis present

## 2018-03-14 DIAGNOSIS — J432 Centrilobular emphysema: Secondary | ICD-10-CM | POA: Diagnosis present

## 2018-03-14 DIAGNOSIS — K5900 Constipation, unspecified: Secondary | ICD-10-CM | POA: Diagnosis not present

## 2018-03-14 DIAGNOSIS — J9811 Atelectasis: Secondary | ICD-10-CM | POA: Diagnosis not present

## 2018-03-14 DIAGNOSIS — I4510 Unspecified right bundle-branch block: Secondary | ICD-10-CM

## 2018-03-14 DIAGNOSIS — R918 Other nonspecific abnormal finding of lung field: Secondary | ICD-10-CM | POA: Diagnosis not present

## 2018-03-14 DIAGNOSIS — Z87891 Personal history of nicotine dependence: Secondary | ICD-10-CM | POA: Diagnosis not present

## 2018-03-14 DIAGNOSIS — J939 Pneumothorax, unspecified: Secondary | ICD-10-CM | POA: Diagnosis not present

## 2018-03-14 DIAGNOSIS — I480 Paroxysmal atrial fibrillation: Secondary | ICD-10-CM | POA: Diagnosis not present

## 2018-03-14 DIAGNOSIS — Z981 Arthrodesis status: Secondary | ICD-10-CM | POA: Diagnosis not present

## 2018-03-14 HISTORY — DX: Cardiac arrhythmia, unspecified: I49.9

## 2018-03-14 HISTORY — DX: Gastro-esophageal reflux disease without esophagitis: K21.9

## 2018-03-14 HISTORY — DX: Unspecified osteoarthritis, unspecified site: M19.90

## 2018-03-14 LAB — BLOOD GAS, ARTERIAL
Acid-Base Excess: 0.6 mmol/L (ref 0.0–2.0)
Bicarbonate: 24.5 mmol/L (ref 20.0–28.0)
Drawn by: 42180
FIO2: 21
O2 Saturation: 95.2 %
Patient temperature: 98.6
pCO2 arterial: 37.5 mmHg (ref 32.0–48.0)
pH, Arterial: 7.43 (ref 7.350–7.450)
pO2, Arterial: 88.5 mmHg (ref 83.0–108.0)

## 2018-03-14 LAB — COMPREHENSIVE METABOLIC PANEL
ALT: 46 U/L (ref 17–63)
AST: 37 U/L (ref 15–41)
Albumin: 4.2 g/dL (ref 3.5–5.0)
Alkaline Phosphatase: 59 U/L (ref 38–126)
Anion gap: 12 (ref 5–15)
BUN: 16 mg/dL (ref 6–20)
CO2: 20 mmol/L — ABNORMAL LOW (ref 22–32)
Calcium: 9.4 mg/dL (ref 8.9–10.3)
Chloride: 104 mmol/L (ref 101–111)
Creatinine, Ser: 1.22 mg/dL (ref 0.61–1.24)
GFR calc Af Amer: >60 mL/min (ref >60)
GFR calc non Af Amer: >60 mL/min (ref >60)
Glucose, Bld: 109 mg/dL — ABNORMAL HIGH (ref 65–99)
Potassium: 4 mmol/L (ref 3.5–5.1)
Sodium: 136 mmol/L (ref 135–145)
Total Bilirubin: 0.8 mg/dL (ref 0.3–1.2)
Total Protein: 7 g/dL (ref 6.5–8.1)

## 2018-03-14 LAB — CBC
HCT: 41.7 % (ref 39.0–52.0)
Hemoglobin: 14.7 g/dL (ref 13.0–17.0)
MCH: 33.3 pg (ref 26.0–34.0)
MCHC: 35.3 g/dL (ref 30.0–36.0)
MCV: 94.3 fL (ref 78.0–100.0)
Platelets: 215 10*3/uL (ref 150–400)
RBC: 4.42 MIL/uL (ref 4.22–5.81)
RDW: 12.6 % (ref 11.5–15.5)
WBC: 6.7 10*3/uL (ref 4.0–10.5)

## 2018-03-14 LAB — URINALYSIS, ROUTINE W REFLEX MICROSCOPIC
Bacteria, UA: NONE SEEN
Bilirubin Urine: NEGATIVE
Glucose, UA: NEGATIVE mg/dL
Ketones, ur: NEGATIVE mg/dL
Leukocytes, UA: NEGATIVE
Nitrite: NEGATIVE
Protein, ur: NEGATIVE mg/dL
Specific Gravity, Urine: 1.006 (ref 1.005–1.030)
pH: 5 (ref 5.0–8.0)

## 2018-03-14 LAB — TYPE AND SCREEN
ABO/RH(D): B POS
Antibody Screen: NEGATIVE

## 2018-03-14 LAB — SURGICAL PCR SCREEN
MRSA, PCR: NEGATIVE
Staphylococcus aureus: NEGATIVE

## 2018-03-14 LAB — PROTIME-INR
INR: 0.9
Prothrombin Time: 12.1 seconds (ref 11.4–15.2)

## 2018-03-14 LAB — APTT: aPTT: 28 seconds (ref 24–36)

## 2018-03-14 LAB — ABO/RH: ABO/RH(D): B POS

## 2018-03-14 NOTE — Progress Notes (Signed)
Denies having a recent EKG.

## 2018-03-14 NOTE — Progress Notes (Signed)
Pcp is Dr. Martha ClanWilliam Shaw Denies seeing a  cardiologist  Denies chest pain, cough, or fever. Denies ever having a card cath or echo States he had a stress test over 20 years ago.

## 2018-03-15 NOTE — Progress Notes (Signed)
Anesthesia Chart Review: Patient is a 67 year old scheduled for thymectomy on 03/16/18 by Dr. Charlett LangoSteven Hendrickson. Patient had a incidental finding of anterior mediastinal mass on 08/20/17 chest CT for lung cancer screening. According to Dr. Sunday CornHendrickson's notes, "Differential diagnosis includes thymoma, lymphoma, germ cell tumor, and ectopic thyroid. Of these thymoma is the most likely. The mass appears relatively well encapsulated and surrounded by fatty tissue, there is no evidence of invasion of surrounding structures. He does not have any myasthenia symptoms." Management options discussed, but surgery was favored given size of mass. Patient opted to postpone surgery until 2019.  History includes former smoker (quit '06), HTN, hypothyroidism, HLD, GERD, dysrhythmia (not specified), anterior mediastinal mass, arthritis, left inguinal hernia repair. He had coronary calcifications on recent chest CT which was noted by Dr. Dorris FetchHendrickson, but patient asymptomatic. He also had a 2.7 cm abdominal aorta on 08/20/17 U/S with 5 year follow-up recommended. BMI is consistent obesity.  PCP is Dr. Martha ClanWilliam Shaw. He does not see a cardiologist. Reported a stress test > 20 years ago. Denied chest pain, cough, fever.  EKG 03/14/18: NSR, incomplete right BBB. Incomplete RBBB pattern in V1, otherwise I think the remaining leads appear similar to 05/19/06 tracing in MUSE.  CXR 03/14/18: IMPRESSION: No acute cardiopulmonary disease. Anterior mediastinal mass best seen on CT.  Chest CT 01/04/18: IMPRESSION: - Stable 2.2 cm nonspecific anterior mediastinal mass. This is suspicious for thymoma, with differential diagnosis including lymphoma and less likely germ cell tumor. - Mild centrilobular emphysema.  No active lung disease. - Aortic Atherosclerosis (ICD10-I70.0). Coronary artery calcification.  Aorta U/S 08/20/17: IMPRESSION: Maximal abdominal aortic diameter is 2.7 cm. Ectatic abdominal aorta at risk for aneurysm  development. Recommend followup by ultrasound in 5 years. This recommendation follows ACR consensus guidelines: White Paper of the ACR Incidental Findings Committee II on Vascular Findings. J Am Coll Radiol 2013; 10:789-794.  Preoperative labs noted. Cr 1.22. Glucose 109. CBC WNL. PT/PTT WNL. T&S done. UA was negative leukocytes, nitrites.   If no acute changes then I anticipate that he can proceed as planned. Anesthesiologist to evaluate on the day of surgery.   Velna Ochsllison Kendre Jacinto, PA-C [facility] Phone [phone] 03/15/2018 9:41 AM

## 2018-03-16 ENCOUNTER — Inpatient Hospital Stay (HOSPITAL_COMMUNITY): Payer: PPO | Admitting: Vascular Surgery

## 2018-03-16 ENCOUNTER — Inpatient Hospital Stay (HOSPITAL_COMMUNITY)
Admission: RE | Admit: 2018-03-16 | Discharge: 2018-03-19 | DRG: 803 | Disposition: A | Discharge status: Home / Self Care | Payer: PPO | Source: Ambulatory Visit | Attending: Thoracic Surgery (Cardiothoracic Vascular Surgery) | Admitting: Thoracic Surgery (Cardiothoracic Vascular Surgery)

## 2018-03-16 ENCOUNTER — Inpatient Hospital Stay (HOSPITAL_COMMUNITY): Payer: PPO

## 2018-03-16 ENCOUNTER — Inpatient Hospital Stay (HOSPITAL_COMMUNITY): Payer: PPO | Admitting: Certified Registered Nurse Anesthetist

## 2018-03-16 ENCOUNTER — Encounter (HOSPITAL_COMMUNITY): Payer: Self-pay | Admitting: Certified Registered Nurse Anesthetist

## 2018-03-16 ENCOUNTER — Encounter (HOSPITAL_COMMUNITY)
Admission: RE | Disposition: A | Discharge status: Home / Self Care | Payer: Self-pay | Source: Ambulatory Visit | Attending: Thoracic Surgery (Cardiothoracic Vascular Surgery)

## 2018-03-16 DIAGNOSIS — Z9089 Acquired absence of other organs: Secondary | ICD-10-CM

## 2018-03-16 DIAGNOSIS — J432 Centrilobular emphysema: Secondary | ICD-10-CM | POA: Diagnosis present

## 2018-03-16 DIAGNOSIS — K219 Gastro-esophageal reflux disease without esophagitis: Secondary | ICD-10-CM | POA: Diagnosis present

## 2018-03-16 DIAGNOSIS — Z87891 Personal history of nicotine dependence: Secondary | ICD-10-CM | POA: Diagnosis not present

## 2018-03-16 DIAGNOSIS — I700 Atherosclerosis of aorta: Secondary | ICD-10-CM | POA: Diagnosis present

## 2018-03-16 DIAGNOSIS — I480 Paroxysmal atrial fibrillation: Secondary | ICD-10-CM | POA: Diagnosis not present

## 2018-03-16 DIAGNOSIS — E785 Hyperlipidemia, unspecified: Secondary | ICD-10-CM | POA: Diagnosis present

## 2018-03-16 DIAGNOSIS — E328 Other diseases of thymus: Principal | ICD-10-CM | POA: Diagnosis present

## 2018-03-16 DIAGNOSIS — E039 Hypothyroidism, unspecified: Secondary | ICD-10-CM | POA: Diagnosis present

## 2018-03-16 DIAGNOSIS — Z09 Encounter for follow-up examination after completed treatment for conditions other than malignant neoplasm: Secondary | ICD-10-CM

## 2018-03-16 DIAGNOSIS — I2510 Atherosclerotic heart disease of native coronary artery without angina pectoris: Secondary | ICD-10-CM | POA: Diagnosis present

## 2018-03-16 DIAGNOSIS — I10 Essential (primary) hypertension: Secondary | ICD-10-CM | POA: Diagnosis present

## 2018-03-16 DIAGNOSIS — J9859 Other diseases of mediastinum, not elsewhere classified: Secondary | ICD-10-CM | POA: Diagnosis present

## 2018-03-16 DIAGNOSIS — J9811 Atelectasis: Secondary | ICD-10-CM | POA: Diagnosis not present

## 2018-03-16 DIAGNOSIS — K5900 Constipation, unspecified: Secondary | ICD-10-CM | POA: Diagnosis not present

## 2018-03-16 DIAGNOSIS — R222 Localized swelling, mass and lump, trunk: Secondary | ICD-10-CM | POA: Diagnosis present

## 2018-03-16 HISTORY — PX: THYMECTOMY: SHX6121

## 2018-03-16 SURGERY — THYMECTOMY
Anesthesia: General | Site: Chest

## 2018-03-16 MED ORDER — PHENYLEPHRINE 40 MCG/ML (10ML) SYRINGE FOR IV PUSH (FOR BLOOD PRESSURE SUPPORT)
PREFILLED_SYRINGE | INTRAVENOUS | Status: AC
  Filled 2018-03-16: qty 20

## 2018-03-16 MED ORDER — FENTANYL CITRATE (PF) 250 MCG/5ML IJ SOLN
INTRAMUSCULAR | Status: AC
  Filled 2018-03-16: qty 5

## 2018-03-16 MED ORDER — DIPHENHYDRAMINE HCL 50 MG/ML IJ SOLN: 12.5000 mg | Freq: Four times a day (QID) | INTRAMUSCULAR | Status: DC | PRN

## 2018-03-16 MED ORDER — NALOXONE HCL 0.4 MG/ML IJ SOLN
0.4000 mg | INTRAMUSCULAR | Status: DC | PRN
  Filled 2018-03-16: qty 1

## 2018-03-16 MED ORDER — LACTATED RINGERS IV SOLN
INTRAVENOUS | Status: DC
  Administered 2018-03-16 (×3): via INTRAVENOUS

## 2018-03-16 MED ORDER — SODIUM CHLORIDE 0.9 % IR SOLN
Status: DC | PRN
  Administered 2018-03-16: 2000 mL

## 2018-03-16 MED ORDER — BUPIVACAINE LIPOSOME 1.3 % IJ SUSP
20.0000 mL | Freq: Once | INTRAMUSCULAR | Status: AC
  Administered 2018-03-16: 2 mL
  Filled 2018-03-16: qty 20

## 2018-03-16 MED ORDER — PROPOFOL 10 MG/ML IV BOLUS
INTRAVENOUS | Status: DC | PRN
  Administered 2018-03-16: 200 mg via INTRAVENOUS

## 2018-03-16 MED ORDER — ROCURONIUM BROMIDE 10 MG/ML (PF) SYRINGE
PREFILLED_SYRINGE | INTRAVENOUS | Status: AC
  Filled 2018-03-16: qty 10

## 2018-03-16 MED ORDER — FENTANYL CITRATE (PF) 100 MCG/2ML IJ SOLN
INTRAMUSCULAR | Status: DC | PRN
  Administered 2018-03-16 (×2): 50 ug via INTRAVENOUS
  Administered 2018-03-16: 100 ug via INTRAVENOUS
  Administered 2018-03-16 (×2): 50 ug via INTRAVENOUS

## 2018-03-16 MED ORDER — PHENYLEPHRINE HCL 10 MG/ML IJ SOLN
INTRAVENOUS | Status: DC | PRN
  Administered 2018-03-16: 20 ug/min via INTRAVENOUS

## 2018-03-16 MED ORDER — FENTANYL CITRATE (PF) 100 MCG/2ML IJ SOLN
INTRAMUSCULAR | Status: AC
  Filled 2018-03-16: qty 2

## 2018-03-16 MED ORDER — FENTANYL 40 MCG/ML IV SOLN
INTRAVENOUS | Status: DC
  Administered 2018-03-16: 20:00:00 via INTRAVENOUS
  Administered 2018-03-17: 195 ug via INTRAVENOUS
  Administered 2018-03-17: 120 ug via INTRAVENOUS
  Administered 2018-03-17: 1000 ug via INTRAVENOUS
  Administered 2018-03-17: 105 ug via INTRAVENOUS
  Administered 2018-03-17: 195 ug via INTRAVENOUS
  Administered 2018-03-17: 240 ug via INTRAVENOUS
  Administered 2018-03-17: 30 ug via INTRAVENOUS
  Administered 2018-03-18: 90 ug via INTRAVENOUS
  Administered 2018-03-18: 131 ug via INTRAVENOUS
  Administered 2018-03-18: 120 ug via INTRAVENOUS
  Administered 2018-03-18: 105 ug via INTRAVENOUS
  Filled 2018-03-16 (×3): qty 25

## 2018-03-16 MED ORDER — DEXAMETHASONE SODIUM PHOSPHATE 10 MG/ML IJ SOLN
INTRAMUSCULAR | Status: DC | PRN
  Administered 2018-03-16: 10 mg via INTRAVENOUS

## 2018-03-16 MED ORDER — OXYCODONE HCL 5 MG/5ML PO SOLN: 5.0000 mg | Freq: Once | ORAL | Status: DC | PRN

## 2018-03-16 MED ORDER — ONDANSETRON HCL 4 MG/2ML IJ SOLN
INTRAMUSCULAR | Status: DC | PRN
  Administered 2018-03-16: 4 mg via INTRAVENOUS

## 2018-03-16 MED ORDER — ONDANSETRON HCL 4 MG/2ML IJ SOLN
INTRAMUSCULAR | Status: AC
  Filled 2018-03-16: qty 2

## 2018-03-16 MED ORDER — CEFAZOLIN SODIUM-DEXTROSE 2-4 GM/100ML-% IV SOLN
2.0000 g | INTRAVENOUS | Status: AC
  Administered 2018-03-16 (×2): 2 g via INTRAVENOUS

## 2018-03-16 MED ORDER — PHENYLEPHRINE HCL 10 MG/ML IJ SOLN
INTRAMUSCULAR | Status: DC | PRN
  Administered 2018-03-16 (×3): 80 ug via INTRAVENOUS
  Administered 2018-03-16: 120 ug via INTRAVENOUS
  Administered 2018-03-16 (×3): 80 ug via INTRAVENOUS
  Administered 2018-03-16: 120 ug via INTRAVENOUS

## 2018-03-16 MED ORDER — MIDAZOLAM HCL 2 MG/2ML IJ SOLN
INTRAMUSCULAR | Status: AC
  Filled 2018-03-16: qty 2

## 2018-03-16 MED ORDER — OXYCODONE HCL 5 MG PO TABS: 5.0000 mg | ORAL_TABLET | Freq: Once | ORAL | Status: DC | PRN

## 2018-03-16 MED ORDER — ONDANSETRON HCL 4 MG/2ML IJ SOLN: 4.0000 mg | Freq: Once | INTRAMUSCULAR | Status: DC | PRN

## 2018-03-16 MED ORDER — LIDOCAINE HCL (CARDIAC) PF 100 MG/5ML IV SOSY
PREFILLED_SYRINGE | INTRAVENOUS | Status: DC | PRN
  Administered 2018-03-16: 40 mg via INTRAVENOUS

## 2018-03-16 MED ORDER — ROCURONIUM BROMIDE 100 MG/10ML IV SOLN
INTRAVENOUS | Status: DC | PRN
  Administered 2018-03-16 (×3): 20 mg via INTRAVENOUS
  Administered 2018-03-16: 50 mg via INTRAVENOUS
  Administered 2018-03-16: 20 mg via INTRAVENOUS
  Administered 2018-03-16: 30 mg via INTRAVENOUS
  Administered 2018-03-16 (×3): 20 mg via INTRAVENOUS

## 2018-03-16 MED ORDER — SUGAMMADEX SODIUM 500 MG/5ML IV SOLN
INTRAVENOUS | Status: AC
  Filled 2018-03-16: qty 5

## 2018-03-16 MED ORDER — MIDAZOLAM HCL 5 MG/5ML IJ SOLN
INTRAMUSCULAR | Status: DC | PRN
  Administered 2018-03-16: 2 mg via INTRAVENOUS

## 2018-03-16 MED ORDER — SUGAMMADEX SODIUM 200 MG/2ML IV SOLN
INTRAVENOUS | Status: DC | PRN
  Administered 2018-03-16: 500 mg via INTRAVENOUS

## 2018-03-16 MED ORDER — FENTANYL CITRATE (PF) 100 MCG/2ML IJ SOLN
25.0000 ug | INTRAMUSCULAR | Status: DC | PRN
  Administered 2018-03-16 (×3): 50 ug via INTRAVENOUS

## 2018-03-16 MED ORDER — 0.9 % SODIUM CHLORIDE (POUR BTL) OPTIME
TOPICAL | Status: DC | PRN
  Administered 2018-03-16: 50 mL

## 2018-03-16 MED ORDER — DIPHENHYDRAMINE HCL 12.5 MG/5ML PO ELIX: 12.5000 mg | ORAL_SOLUTION | Freq: Four times a day (QID) | ORAL | Status: DC | PRN

## 2018-03-16 MED ORDER — SODIUM CHLORIDE 0.9% FLUSH: 9.0000 mL | INTRAVENOUS | Status: DC | PRN

## 2018-03-16 MED ORDER — CEFAZOLIN SODIUM-DEXTROSE 2-4 GM/100ML-% IV SOLN
2.0000 g | Freq: Three times a day (TID) | INTRAVENOUS | Status: AC
  Administered 2018-03-17 (×2): 2 g via INTRAVENOUS
  Filled 2018-03-16 (×2): qty 100

## 2018-03-16 MED ORDER — DEXAMETHASONE SODIUM PHOSPHATE 10 MG/ML IJ SOLN
INTRAMUSCULAR | Status: AC
  Filled 2018-03-16: qty 1

## 2018-03-16 MED ORDER — ONDANSETRON HCL 4 MG/2ML IJ SOLN: 4.0000 mg | Freq: Four times a day (QID) | INTRAMUSCULAR | Status: DC | PRN

## 2018-03-16 MED ORDER — DEXTROSE-NACL 5-0.9 % IV SOLN
INTRAVENOUS | Status: DC
  Filled 2018-03-16: qty 1000

## 2018-03-16 MED ORDER — CEFAZOLIN SODIUM-DEXTROSE 2-4 GM/100ML-% IV SOLN
INTRAVENOUS | Status: AC
  Filled 2018-03-16: qty 100

## 2018-03-16 SURGICAL SUPPLY — 117 items
ADAPTER CARDIO PERF ANTE/RETRO (ADAPTER) IMPLANT
ADH SKN CLS LQ APL DERMABOND (GAUZE/BANDAGES/DRESSINGS) ×1
ADPR PRFSN 84XANTGRD RTRGD (ADAPTER)
APPLIER CLIP 9.375 MED OPEN (MISCELLANEOUS)
APPLIER CLIP 9.375 SM OPEN (CLIP)
APR CLP MED 9.3 20 MLT OPN (MISCELLANEOUS)
APR CLP SM 9.3 20 MLT OPN (CLIP)
ATTRACTOMAT 16X20 MAGNETIC DRP (DRAPES) ×2 IMPLANT
BAG DECANTER FOR FLEXI CONT (MISCELLANEOUS) ×1 IMPLANT
BLADE CLIPPER SURG (BLADE) ×1 IMPLANT
BLADE SURG 11 STRL SS (BLADE) IMPLANT
CANISTER SUCT 3000ML PPV (MISCELLANEOUS) ×2 IMPLANT
CANNULA GUNDRY RCSP 15FR (MISCELLANEOUS) IMPLANT
CATH THORACIC 28FR (CATHETERS) IMPLANT
CLIP APPLIE 9.375 MED OPEN (MISCELLANEOUS) IMPLANT
CLIP APPLIE 9.375 SM OPEN (CLIP) IMPLANT
CLIP FOGARTY SPRING 6M (CLIP) IMPLANT
CLIP VESOCCLUDE MED 24/CT (CLIP) IMPLANT
CLIP VESOCCLUDE MED 6/CT (CLIP) IMPLANT
CLIP VESOCCLUDE SM WIDE 24/CT (CLIP) IMPLANT
CLIP VESOLOCK MED 6/CT (CLIP) ×1 IMPLANT
CONN ST 1/4X3/8  BEN (MISCELLANEOUS) ×2
CONN ST 1/4X3/8 BEN (MISCELLANEOUS) IMPLANT
CONN Y 3/8X3/8X3/8  BEN (MISCELLANEOUS)
CONN Y 3/8X3/8X3/8 BEN (MISCELLANEOUS) IMPLANT
CONT SPEC 4OZ CLIKSEAL STRL BL (MISCELLANEOUS) ×1 IMPLANT
COVER SURGICAL LIGHT HANDLE (MISCELLANEOUS) ×2 IMPLANT
CRADLE DONUT ADULT HEAD (MISCELLANEOUS) ×2 IMPLANT
CUTTER ECHEON FLEX ENDO 45 340 (ENDOMECHANICALS) ×1 IMPLANT
DERMABOND ADHESIVE PROPEN (GAUZE/BANDAGES/DRESSINGS) ×1
DERMABOND ADVANCED .7 DNX6 (GAUZE/BANDAGES/DRESSINGS) IMPLANT
DRAIN CHANNEL 28F RND 3/8 FF (WOUND CARE) ×2 IMPLANT
DRAPE CARDIOVASCULAR INCISE (DRAPES)
DRAPE CV SPLIT W-CLR ANES SCRN (DRAPES) ×1 IMPLANT
DRAPE PERI GROIN 82X75IN TIB (DRAPES) ×1 IMPLANT
DRAPE SLUSH/WARMER DISC (DRAPES) ×1 IMPLANT
DRAPE SRG 135X102X78XABS (DRAPES) ×1 IMPLANT
DRSG COVADERM 4X14 (GAUZE/BANDAGES/DRESSINGS) ×1 IMPLANT
ELECT CAUTERY BLADE 6.4 (BLADE) ×1 IMPLANT
ELECT REM PT RETURN 9FT ADLT (ELECTROSURGICAL) ×2
ELECTRODE REM PT RTRN 9FT ADLT (ELECTROSURGICAL) ×2 IMPLANT
GAUZE SPONGE 4X4 12PLY STRL (GAUZE/BANDAGES/DRESSINGS) ×4 IMPLANT
GLOVE BIO SURGEON STRL SZ 6 (GLOVE) IMPLANT
GLOVE BIO SURGEON STRL SZ 6.5 (GLOVE) ×2 IMPLANT
GLOVE BIO SURGEON STRL SZ7 (GLOVE) ×1 IMPLANT
GLOVE BIO SURGEON STRL SZ7.5 (GLOVE) IMPLANT
GLOVE BIOGEL PI IND STRL 7.0 (GLOVE) ×2 IMPLANT
GLOVE BIOGEL PI INDICATOR 7.0 (GLOVE) ×2
GLOVE SKINSENSE NS SZ6.5 (GLOVE)
GLOVE SKINSENSE STRL SZ6.5 (GLOVE) IMPLANT
GLOVE SURG SIGNA 7.5 PF LTX (GLOVE) ×4 IMPLANT
GLOVE SURG SS PI 6.0 STRL IVOR (GLOVE) ×1 IMPLANT
GOWN STRL REUS W/ TWL LRG LVL3 (GOWN DISPOSABLE) ×12 IMPLANT
GOWN STRL REUS W/ TWL XL LVL3 (GOWN DISPOSABLE) ×2 IMPLANT
GOWN STRL REUS W/TWL LRG LVL3 (GOWN DISPOSABLE) ×6
GOWN STRL REUS W/TWL XL LVL3 (GOWN DISPOSABLE) ×2
HEMOSTAT SURGICEL 2X14 (HEMOSTASIS) IMPLANT
INSERT FOGARTY XLG (MISCELLANEOUS) IMPLANT
KIT BASIN OR (CUSTOM PROCEDURE TRAY) ×2 IMPLANT
KIT SUCTION CATH 14FR (SUCTIONS) IMPLANT
KIT TURNOVER KIT B (KITS) ×2 IMPLANT
KIT VASOVIEW HEMOPRO VH 3000 (KITS) IMPLANT
LINE VENT (MISCELLANEOUS) IMPLANT
NDL SPNL 22GX3.5 QUINCKE BK (NEEDLE) IMPLANT
NEEDLE SPNL 22GX3.5 QUINCKE BK (NEEDLE) ×2 IMPLANT
NS IRRIG 1000ML POUR BTL (IV SOLUTION) ×7 IMPLANT
PACK CHEST (CUSTOM PROCEDURE TRAY) ×1 IMPLANT
PACK E OPEN HEART (SUTURE) ×1 IMPLANT
PACK OPEN HEART (CUSTOM PROCEDURE TRAY) ×1 IMPLANT
PAD ARMBOARD 7.5X6 YLW CONV (MISCELLANEOUS) ×4 IMPLANT
PAD ELECT DEFIB RADIOL ZOLL (MISCELLANEOUS) ×1 IMPLANT
RELOAD STAPLE 45 GOLD REG/THCK (STAPLE) IMPLANT
SET CARDIOPLEGIA MPS 5001102 (MISCELLANEOUS) IMPLANT
SHEARS HARMONIC HDI 36CM (ELECTROSURGICAL) ×1 IMPLANT
SOLUTION ANTI FOG 6CC (MISCELLANEOUS) ×1 IMPLANT
SPONGE INTESTINAL PEANUT (DISPOSABLE) ×7 IMPLANT
SPONGE LAP 18X18 X RAY DECT (DISPOSABLE) IMPLANT
SPONGE LAP 4X18 X RAY DECT (DISPOSABLE) IMPLANT
SPONGE TONSIL 1 RF SGL (DISPOSABLE) ×1 IMPLANT
STAPLE RELOAD 45MM GOLD (STAPLE) ×2 IMPLANT
STOPCOCK 4 WAY LG BORE MALE ST (IV SETS) IMPLANT
SUT ETHIBOND 2 0 SH (SUTURE)
SUT ETHIBOND 2 0 SH 36X2 (SUTURE) IMPLANT
SUT MNCRL AB 4-0 PS2 18 (SUTURE) IMPLANT
SUT MON AB 4-0 P3 18 (SUTURE) IMPLANT
SUT PROLENE 3 0 SH 1 (SUTURE) IMPLANT
SUT PROLENE 4 0 RB 1 (SUTURE) ×2
SUT PROLENE 4-0 RB1 .5 CRCL 36 (SUTURE) IMPLANT
SUT PROLENE 5 0 C 1 36 (SUTURE) IMPLANT
SUT PROLENE 6 0 C 1 30 (SUTURE) IMPLANT
SUT PROLENE 7 0 BV 1 (SUTURE) IMPLANT
SUT PROLENE 7 0 BV1 MDA (SUTURE) IMPLANT
SUT PROLENE 8 0 BV175 6 (SUTURE) IMPLANT
SUT SILK  1 MH (SUTURE) ×4
SUT SILK 1 MH (SUTURE) ×3 IMPLANT
SUT STEEL 6MS V (SUTURE) IMPLANT
SUT STEEL STERNAL CCS#1 18IN (SUTURE) IMPLANT
SUT STEEL SZ 6 DBL 3X14 BALL (SUTURE) IMPLANT
SUT VIC AB 1 CTX 36 (SUTURE) ×4
SUT VIC AB 1 CTX36XBRD ANBCTR (SUTURE) IMPLANT
SUT VIC AB 2-0 CT1 36 (SUTURE) IMPLANT
SUT VIC AB 2-0 CTX 27 (SUTURE) ×2 IMPLANT
SUT VIC AB 3-0 SH 27 (SUTURE)
SUT VIC AB 3-0 SH 27X BRD (SUTURE) IMPLANT
SUT VIC AB 3-0 X1 27 (SUTURE) ×2 IMPLANT
SUT VICRYL 4-0 PS2 18IN ABS (SUTURE) IMPLANT
SYR CONTROL 10ML LL (SYRINGE) ×1 IMPLANT
SYSTEM SAHARA CHEST DRAIN ATS (WOUND CARE) ×1 IMPLANT
SYSTEM SAHARA CHEST DRAIN RE-I (WOUND CARE) ×1 IMPLANT
TAPE CLOTH SURG 4X10 WHT LF (GAUZE/BANDAGES/DRESSINGS) ×2 IMPLANT
TOWEL GREEN STERILE (TOWEL DISPOSABLE) ×2 IMPLANT
TOWEL GREEN STERILE FF (TOWEL DISPOSABLE) ×2 IMPLANT
TRAY CATH LUMEN 1 20CM STRL (SET/KITS/TRAYS/PACK) IMPLANT
TROCAR XCEL NON-BLD 5MMX100MML (ENDOMECHANICALS) ×1 IMPLANT
TUBING INSUFFLATION 10FT LAP (TUBING) IMPLANT
UNDERPAD 30X30 (UNDERPADS AND DIAPERS) ×1 IMPLANT
WATER STERILE IRR 1000ML POUR (IV SOLUTION) ×4 IMPLANT

## 2018-03-16 NOTE — Anesthesia Preprocedure Evaluation (Addendum)
Anesthesia Evaluation  Patient identified by MRN, date of birth, ID band Patient awake    Reviewed: Allergy & Precautions, NPO status , Patient's Chart, lab work & pertinent test results  Airway Mallampati: II  TM Distance: >3 FB Neck ROM: Full    Dental  (+) Partial Upper, Partial Lower   Pulmonary former smoker,    breath sounds clear to auscultation       Cardiovascular hypertension,  Rhythm:Regular Rate:Normal     Neuro/Psych    GI/Hepatic   Endo/Other    Renal/GU      Musculoskeletal   Abdominal   Peds  Hematology   Anesthesia Other Findings   Reproductive/Obstetrics                             Anesthesia Physical Anesthesia Plan  ASA: III  Anesthesia Plan: General   Post-op Pain Management:    Induction: Intravenous  PONV Risk Score and Plan: Ondansetron  Airway Management Planned: Double Lumen EBT  Additional Equipment: Arterial line  Intra-op Plan:   Post-operative Plan: Extubation in OR  Informed Consent: I have reviewed the patients History and Physical, chart, labs and discussed the procedure including the risks, benefits and alternatives for the proposed anesthesia with the patient or authorized representative who has indicated his/her understanding and acceptance.   Dental advisory given  Plan Discussed with: CRNA and Anesthesiologist  Anesthesia Plan Comments:         Anesthesia Quick Evaluation

## 2018-03-16 NOTE — H&P (Signed)
[address]       [place] [address]             [phone]                             HPI: Mr. Mario Hill returns to discuss treatment of his anterior mediastinal mass  Mario Hill is a 67 year old man with a past history of tobacco abuse, hypertension, hyperlipidemia, hypothyroidism, and coronary and aortic atherosclerosis on CT.  He had a low-dose CT scan for screening for lung cancer last fall.  He was found to have an anterior mediastinal mass on that scan.  I saw him in September and recommended thymectomy.  Personal reasons he wanted to wait until after the first of the year to have surgery.  I recommended that we do another CT given the delay.  He has not had any new issues in the interim since his last visit.  He gets short of breath with heavy exertion but not with any type of routine activities.  He is not having any chest pain, pressure, or tightness.  He does not have any double vision or weakness.  Past medical history Tobacco abuse-quit 2006 Hypertension Hyperlipidemia Hypothyroidism Atherosclerotic cardiovascular disease    Current Outpatient Medications  Medication Sig Dispense Refill  . atorvastatin (LIPITOR) 40 MG tablet Take 40 mg by mouth at bedtime.  3  . levothyroxine (SYNTHROID, LEVOTHROID) 50 MCG tablet Take 50 mcg by mouth daily.  6  . lisinopril (PRINIVIL,ZESTRIL) 20 MG tablet Take 20 mg by mouth daily.  6  . omeprazole (PRILOSEC) 20 MG capsule Take 20 mg by mouth daily.  3  . sertraline (ZOLOFT) 50 MG tablet Take 50 mg by mouth daily.  5   No current facility-administered medications for this visit.     Physical Exam BP 128/79 (BP Location: Left Arm, Patient Position: Sitting, Cuff Size: Large)   Pulse 72   Resp 18   Ht 5\' 8"  (1.727 m)   Wt 219 lb 12.8 oz (99.7 kg)   SpO2 98% Comment: RA  BMI 33.24 kg/m  67 year old man in no acute distress Alert and oriented x3 with no focal deficits Good strength in all 4  extremities HEENT unremarkable Neck supple without thyromegaly or adenopathy Lungs clear with equal breath sounds bilaterally Cardiac regular rate and rhythm normal S1 and S2 Abdomen soft and nontender Extremities no clubbing cyanosis or edema  Diagnostic Tests: CT CHEST WITHOUT CONTRAST  TECHNIQUE: Multidetector CT imaging of the chest was performed following the standard protocol without IV contrast.  COMPARISON: 08/20/2017  FINDINGS: Cardiovascular: No acute findings. Aortic and coronary artery atherosclerosis.  Mediastinum/Nodes: An anterior mediastinal mass measures 2.2 x 1.8 cm, without significant change compared to previous study. This mass shows no evidence of internal fat or calcification. No other soft tissue masses or lymphadenopathy identified.  Lungs/Pleura: Mild centrilobular emphysema noted. No pulmonary infiltrate or mass identified. No effusion present.  Upper Abdomen: Unremarkable.  Musculoskeletal: No suspicious bone lesions.  IMPRESSION: Stable 2.2 cm nonspecific anterior mediastinal mass. This is suspicious for thymoma, with differential diagnosis including lymphoma and less likely germ cell tumor.  Mild centrilobular emphysema. No active lung disease.  Aortic Atherosclerosis (ICD10-I70.0). Coronary artery calcification.   Electronically Signed By: Myles Rosenthal M.D. On: 01/04/2018 12:48 I personally reviewed the CT images and concur with the findings noted above.  Impression: Mr. Lindholm is a 67 year old gentleman with  a small stable anterior mediastinal mass.  This is most likely a thymoma, but germ cell tumors and lymphoma are also in the differential.  This is been relatively stable over time.  I still think he would be best served with a thymectomy is over time this could continue to grow and potentially invade surrounding structures.  Continued radiographic follow-up is an option but I would not favor that.  I reviewed  the CT images with Mr. Mrs. Wellen.  We discussed the differential diagnosis.  We talked about surgical approaches including partial sternotomy and VATS.  He strongly prefers VATS.  He does understand there is a possibility that with a right VATS approach we may have to go into the left chest as well.  We reviewed the general nature of the operation including the need for general anesthesia and the incisions to be used.  I informed him of the indications, risks, benefits, and alternatives.  He understands the risks include but are not limited to death, MI, DVT, PE, bleeding possible need for transfusion, infection, phrenic or recurrent nerve injury, as well as possibility of other unforeseeable complications.  He wishes with surgical resection in the next 1-2 months, but has some family issues that need considered.  He will call to schedule surgery.  Plan: Right VATS, possible left VATS for thymectomy.  Patient will call to schedule  Loreli SlotSteven C Maymie Brunke, MD [facility] [phone]             Electronically signed by Loreli SlotHendrickson, Babetta Paterson C, MD at 01/08/2018 10:17 AM

## 2018-03-16 NOTE — Anesthesia Procedure Notes (Signed)
Procedure Name: Intubation Date/Time: 03/16/2018 2:16 PM Performed by: Roberts Gaudy, MD Pre-anesthesia Checklist: Patient identified, Emergency Drugs available, Suction available, Patient being monitored and Timeout performed Patient Re-evaluated:Patient Re-evaluated prior to induction Oxygen Delivery Method: Circle system utilized Preoxygenation: Pre-oxygenation with 100% oxygen Induction Type: IV induction Ventilation: Mask ventilation without difficulty and Oral airway inserted - appropriate to patient size Laryngoscope Size: Mac and 4 Grade View: Grade I Tube type: Oral Endobronchial tube: Double lumen EBT and 39 Fr Number of attempts: 1 Airway Equipment and Method: Patient positioned with wedge pillow and Stylet (Fiberoptic video DLT inserted ) Placement Confirmation: ETT inserted through vocal cords under direct vision,  positive ETCO2 and breath sounds checked- equal and bilateral Secured at: 27 cm Tube secured with: Tape Dental Injury: Teeth and Oropharynx as per pre-operative assessment

## 2018-03-16 NOTE — Anesthesia Procedure Notes (Signed)
Arterial Line Insertion Start/End4/17/2019 1:15 PM, 03/16/2018 1:25 PM Performed by: Elliot DallyHuggins, Anav Lammert, CRNA, CRNA  Patient location: Pre-op. Preanesthetic checklist: patient identified, IV checked, site marked, risks and benefits discussed, surgical consent, monitors and equipment checked, pre-op evaluation, timeout performed and anesthesia consent Left, radial was placed Catheter size: 20 G Hand hygiene performed  and maximum sterile barriers used   Attempts: 1 Procedure performed without using ultrasound guided technique. Following insertion, dressing applied and Biopatch. Post procedure assessment: normal  Patient tolerated the procedure well with no immediate complications.

## 2018-03-16 NOTE — Op Note (Signed)
NAMEEILAM, [place]RE[place] NO.:  [id number]  MEDICAL RECORD NO.:  [id number]  LOCATION:                                 FACILITY:  PHYSICIAN:  Salvatore Decent. Dorris Fetch, M.D. DATE OF BIRTH:  DATE OF PROCEDURE:  03/16/2018 DATE OF DISCHARGE:                              OPERATIVE REPORT   PREOPERATIVE DIAGNOSIS:  Anterior mediastinal mass.  POSTOPERATIVE DIAGNOSIS:  Thymic cyst.  PROCEDURE:  Bilateral video-assisted thoracoscopy for thymectomy.  SURGEON:  Salvatore Decent. Dorris Fetch, M.D.  ASSISTANTS:  Jari Favre, PA and Doree Fudge, [place].  ANESTHESIA:  General.  FINDINGS:  Large mediastinal fat pad.  Phrenic nerves were identified and preserved.  Cyst in the anterior mediastinal fat pad with murky fluid. No definite mass identified.  CLINICAL NOTE:  Mr. Dissinger is a 67 year old man with a history of tobacco abuse, who had a low-dose CT scan for screening for lung cancer and was found to have an anterior mediastinal mass.  I recommended thymectomy. He wanted to wait until after the first of the year.  In February, he returned and the CT again showed the anterior mediastinal mass was not significantly changed.  He again was advised to have surgical resection. Partial sternotomy or bilateral VATS approach was discussed with the patient.  He wished to have bilateral VATS approach.  The indications, risks, benefits, and alternatives were discussed in detail with the patient.  He understood and accepted the risks and agreed to proceed.  DESCRIPTION OF PROCEDURE:  Mr. Butler was brought to the preoperative holding area on March 16, 2018.  Anesthesia placed a central venous catheter and an arterial blood pressure monitoring line.  He was taken to the operating room, anesthetized, and intubated with a double-lumen endotracheal tube.  Intravenous antibiotics were administered. Sequential compression devices were placed on the calves for DVT prophylaxis.  A Foley catheter  was placed.  He was left in a supine position and the right chest was bumped approximately 10 degrees with a roll under the right shoulder.  The chest and abdomen were prepped and draped in usual sterile fashion.  Single lung ventilation of the left lung was initiated.  The saturations would decrease whenever he was on single lung ventilation with the left, but was fine during time when he was on single lung ventilation on the right.  An incision was made in the seventh interspace laterally and a 5 mm port was inserted into the chest.  A 5-cm working incision was made in the chest laterally.  The mediastinal fat pad was identified.  The phrenic nerve was identified.  The Harmonic scalpel was used for the dissection. The pleura was scored 1 cm anterior to the phrenic nerve and the phrenic nerve was preserved throughout its course superiorly.  The pleura was scored anteriorly to the chest wall and then the pleura was scored along the chest wall again using the Harmonic scalpel.  Dissection then was begun along the pericardial surface and then came off the pericardium easily.  The dissection proceeded across the midline and anteriorly, the left pleural space was entered.  Dissection had proceeded as far as possible and in the vicinity  of the lesion seen on CT, a cystic structure was entered.  There was some murky yellowish fluid that was evacuated.  The right lung then was reinflated and the left lung was collapsed.  A port incision was made in the anterolateral sixth interspace on the left and a port was inserted.  A working incision was made in the third interspace anteriorly.  Again, no rib spreading was performed.  The phrenic nerve was difficult to visualize, but once visualized, the dissection remained well anterior to that.  The large feeding vessels from the innominate vein were divided with the Harmonic scalpel with good hemostasis.  The fat pad was completely dissected off the  inferior aspect of the innominate vein and the thymus was removed. There was no palpable mass in the thymus itself.  There was an area of suspicion, but it was suspected that the lesion seen on CT was the cyst that was entered during the dissection.  The remainder of the pericardial fat pad anteriorly was dissected down almost to the diaphragm and additional fatty mediastinal tissue was removed as well, but no mass was seen in those areas either.  28-French Blake drains were placed on either side via the port incisions.  A second small port incision had been made anterior to the working incision on the right chest and the scope was then placed through that.  That incision was closed with a 3-0 Vicryl subcuticular suture.  The working incision was closed with #1 Vicryl fascial sutures followed by 2-0 Vicryl subcutaneous sutures and 3-0 Vicryl subcuticular sutures.  All sponge, needle, and instrument counts were correct at the end of the procedure. The patient was taken from the operating room to the postanesthetic care unit in good condition.     Salvatore DecentSteven C. Dorris FetchHendrickson, M.D.     SCH/MEDQ  D:  03/16/2018  T:  03/16/2018  Job:  [id number]

## 2018-03-16 NOTE — Transfer of Care (Signed)
Immediate Anesthesia Transfer of Care Note  Patient: Mario JollyJohn S Bennett  Procedure(s) Performed: Video Assisted THYMECTOMY. (N/A Chest)  Patient Location: PACU  Anesthesia Type:General  Level of Consciousness: awake, alert  and oriented  Airway & Oxygen Therapy: Patient Spontanous Breathing and Patient connected to face mask oxygen  Post-op Assessment: Report given to RN and Post -op Vital signs reviewed and stable  Post vital signs: Reviewed and stable  Last Vitals:  Vitals Value Taken Time  BP 147/79 03/16/2018  7:13 PM  Temp    Pulse 116 03/16/2018  7:13 PM  Resp 25 03/16/2018  7:13 PM  SpO2 95 % 03/16/2018  7:13 PM  Vitals shown include unvalidated device data.  Last Pain:  Vitals:   03/16/18 1252  TempSrc: Oral         Complications: No apparent anesthesia complications

## 2018-03-16 NOTE — Brief Op Note (Addendum)
03/16/2018  6:53 PM  PATIENT:  Mario GibneyJohn S Hill  67 y.o. male  PRE-OPERATIVE DIAGNOSIS:  ANTERIOR MEDIASTINAL MASS  POST-OPERATIVE DIAGNOSIS:  ANTERIOR MEDIASTINAL CYST  PROCEDURE:  BILATERAL VATS for  THYMECTOMY   SURGEON:  Surgeon(s) and Role:  Loreli SlotHendrickson, Kaius Daino C, MD - Primary  PHYSICIAN ASSISTANT:  Jari Favreessa Conte, PA-C Doree Fudgeonielle Zimmerman, PA-C   ANESTHESIA:   general  EBL:  110 mL   BLOOD ADMINISTERED:none  DRAINS: 2 28 French chest tubes placed in the pleural spaces  LOCAL MEDICATIONS USED:  BUPIVICAINE   SPECIMEN:  Thymus and pericardial fat pad  DISPOSITION OF SPECIMEN:  PATHOLOGY  COUNTS CORRECT :  YES  DICTATION: .Dragon Dictation  PLAN OF CARE: Admit to inpatient   PATIENT DISPOSITION:  PACU   Delay start of Pharmacological VTE agent (>24hrs) due to surgical blood loss or risk of bleeding: no

## 2018-03-16 NOTE — Anesthesia Postprocedure Evaluation (Signed)
Anesthesia Post Note  Patient: Mario Hill  Procedure(s) Performed: Video Assisted THYMECTOMY. (N/A Chest)     Patient location during evaluation: PACU Anesthesia Type: General Level of consciousness: awake and alert Pain management: pain level controlled Vital Signs Assessment: post-procedure vital signs reviewed and stable Respiratory status: spontaneous breathing, nonlabored ventilation, respiratory function stable and patient connected to nasal cannula oxygen Cardiovascular status: blood pressure returned to baseline and stable Postop Assessment: no apparent nausea or vomiting Anesthetic complications: no    Last Vitals:  Vitals:   03/16/18 2130 03/16/18 2145  BP: (!) 153/96 119/82  Pulse: (!) 110 (!) 106  Resp: 19 12  Temp:    SpO2: 96% 95%    Last Pain:  Vitals:   03/16/18 2130  TempSrc:   PainSc: 5                  Kennieth RadFitzgerald, Firas Guardado E

## 2018-03-16 NOTE — Interval H&P Note (Signed)
History and Physical Interval Note: No interval change He prefers VATS approach understanding that bilateral approach may be necessary   03/16/2018 1:13 PM  Mario Hill  has presented today for surgery, with the diagnosis of ANTERIOR MEDIASTINAL MASS  The various methods of treatment have been discussed with the patient and family. After consideration of risks, benefits and other options for treatment, the patient has consented to  Procedure(s): THYMECTOMY (N/A) as a surgical intervention .  The patient's history has been reviewed, patient examined, no change in status, stable for surgery.  I have reviewed the patient's chart and labs.  Questions were answered to the patient's satisfaction.     Loreli SlotSteven C Koralee Wedeking

## 2018-03-17 ENCOUNTER — Other Ambulatory Visit: Payer: Self-pay

## 2018-03-17 ENCOUNTER — Inpatient Hospital Stay (HOSPITAL_COMMUNITY): Payer: PPO

## 2018-03-17 ENCOUNTER — Encounter (HOSPITAL_COMMUNITY): Payer: Self-pay | Admitting: Thoracic Surgery (Cardiothoracic Vascular Surgery)

## 2018-03-17 LAB — BLOOD GAS, ARTERIAL
Acid-Base Excess: 1.3 mmol/L (ref 0.0–2.0)
Bicarbonate: 26.3 mmol/L (ref 20.0–28.0)
Drawn by: 398899
FIO2: 21
O2 Saturation: 86.6 %
Patient temperature: 98.6
pCO2 arterial: 48.2 mmHg — ABNORMAL HIGH (ref 32.0–48.0)
pH, Arterial: 7.356 (ref 7.350–7.450)
pO2, Arterial: 53.9 mmHg — ABNORMAL LOW (ref 83.0–108.0)

## 2018-03-17 LAB — BASIC METABOLIC PANEL
Anion gap: 10 (ref 5–15)
BUN: 16 mg/dL (ref 6–20)
CO2: 24 mmol/L (ref 22–32)
Calcium: 8.5 mg/dL — ABNORMAL LOW (ref 8.9–10.3)
Chloride: 100 mmol/L — ABNORMAL LOW (ref 101–111)
Creatinine, Ser: 1.27 mg/dL — ABNORMAL HIGH (ref 0.61–1.24)
GFR calc Af Amer: >60 mL/min (ref >60)
GFR calc non Af Amer: 57 mL/min — ABNORMAL LOW (ref >60)
Glucose, Bld: 141 mg/dL — ABNORMAL HIGH (ref 65–99)
Potassium: 4.3 mmol/L (ref 3.5–5.1)
Sodium: 134 mmol/L — ABNORMAL LOW (ref 135–145)

## 2018-03-17 LAB — GLUCOSE, CAPILLARY
Glucose-Capillary: 151 mg/dL — ABNORMAL HIGH (ref 65–99)
Glucose-Capillary: 124 mg/dL — ABNORMAL HIGH (ref 65–99)

## 2018-03-17 LAB — CBC
HCT: 39.6 % (ref 39.0–52.0)
Hemoglobin: 12.5 g/dL — ABNORMAL LOW (ref 13.0–17.0)
MCH: 30.4 pg (ref 26.0–34.0)
MCHC: 31.6 g/dL (ref 30.0–36.0)
MCV: 96.4 fL (ref 78.0–100.0)
Platelets: 180 10*3/uL (ref 150–400)
RBC: 4.11 MIL/uL — ABNORMAL LOW (ref 4.22–5.81)
RDW: 14.3 % (ref 11.5–15.5)
WBC: 15.9 10*3/uL — ABNORMAL HIGH (ref 4.0–10.5)

## 2018-03-17 MED ORDER — INSULIN ASPART 100 UNIT/ML ~~LOC~~ SOLN
0.0000 [IU] | SUBCUTANEOUS | Status: DC
  Administered 2018-03-17: 2 [IU] via SUBCUTANEOUS

## 2018-03-17 MED ORDER — OXYCODONE HCL 5 MG PO TABS
5.0000 mg | ORAL_TABLET | ORAL | Status: DC | PRN
  Administered 2018-03-17 – 2018-03-18 (×5): 10 mg via ORAL
  Filled 2018-03-17 (×5): qty 2

## 2018-03-17 MED ORDER — FENTANYL CITRATE (PF) 100 MCG/2ML IJ SOLN: 25.0000 ug | INTRAMUSCULAR | Status: DC | PRN

## 2018-03-17 MED ORDER — SERTRALINE HCL 50 MG PO TABS
50.0000 mg | ORAL_TABLET | Freq: Every day | ORAL | Status: DC
  Administered 2018-03-17 – 2018-03-19 (×3): 50 mg via ORAL
  Filled 2018-03-17 (×3): qty 1

## 2018-03-17 MED ORDER — SENNOSIDES-DOCUSATE SODIUM 8.6-50 MG PO TABS
1.0000 | ORAL_TABLET | Freq: Every day | ORAL | Status: DC
  Administered 2018-03-17 – 2018-03-18 (×3): 1 via ORAL
  Filled 2018-03-17 (×3): qty 1

## 2018-03-17 MED ORDER — SODIUM CHLORIDE 0.9 % IV SOLN
INTRAVENOUS | Status: DC
  Administered 2018-03-17 – 2018-03-18 (×4): via INTRAVENOUS

## 2018-03-17 MED ORDER — ATORVASTATIN CALCIUM 40 MG PO TABS
40.0000 mg | ORAL_TABLET | Freq: Every day | ORAL | Status: DC
  Administered 2018-03-17 – 2018-03-18 (×2): 40 mg via ORAL
  Filled 2018-03-17 (×2): qty 1

## 2018-03-17 MED ORDER — BISACODYL 5 MG PO TBEC
10.0000 mg | DELAYED_RELEASE_TABLET | Freq: Every day | ORAL | Status: DC
  Administered 2018-03-17 – 2018-03-19 (×3): 10 mg via ORAL
  Filled 2018-03-17 (×3): qty 2

## 2018-03-17 MED ORDER — POTASSIUM CHLORIDE 10 MEQ/50ML IV SOLN: 10.0000 meq | Freq: Every day | INTRAVENOUS | Status: DC | PRN

## 2018-03-17 MED ORDER — LEVOTHYROXINE SODIUM 50 MCG PO TABS
50.0000 ug | ORAL_TABLET | Freq: Every day | ORAL | Status: DC
  Administered 2018-03-17 – 2018-03-19 (×3): 50 ug via ORAL
  Filled 2018-03-17 (×3): qty 1

## 2018-03-17 MED ORDER — TRAMADOL HCL 50 MG PO TABS
50.0000 mg | ORAL_TABLET | Freq: Four times a day (QID) | ORAL | Status: DC | PRN
  Administered 2018-03-18: 100 mg via ORAL
  Filled 2018-03-17: qty 2

## 2018-03-17 MED ORDER — ENOXAPARIN SODIUM 40 MG/0.4ML ~~LOC~~ SOLN
40.0000 mg | Freq: Every day | SUBCUTANEOUS | Status: DC
  Administered 2018-03-17 – 2018-03-18 (×2): 40 mg via SUBCUTANEOUS
  Filled 2018-03-17 (×2): qty 0.4

## 2018-03-17 MED ORDER — ACETAMINOPHEN 160 MG/5ML PO SOLN: 1000.0000 mg | Freq: Four times a day (QID) | ORAL | Status: DC

## 2018-03-17 MED ORDER — ACETAMINOPHEN 500 MG PO TABS
1000.0000 mg | ORAL_TABLET | Freq: Four times a day (QID) | ORAL | Status: DC
  Administered 2018-03-17 – 2018-03-19 (×11): 1000 mg via ORAL
  Filled 2018-03-17 (×11): qty 2

## 2018-03-17 MED ORDER — PANTOPRAZOLE SODIUM 40 MG PO TBEC
40.0000 mg | DELAYED_RELEASE_TABLET | Freq: Every day | ORAL | Status: DC
  Administered 2018-03-17 – 2018-03-19 (×3): 40 mg via ORAL
  Filled 2018-03-17 (×3): qty 1

## 2018-03-17 NOTE — Progress Notes (Signed)
1 Day Post-Op Procedure(s) (LRB): Video Assisted THYMECTOMY. (N/A) Subjective: Some incisional pain  Objective: Vital signs in last 24 hours: Temp:  [97.4 F (36.3 C)-98.2 F (36.8 C)] 98.2 F (36.8 C) (04/18 0750) Pulse Rate:  [89-120] 108 (04/18 0750) Cardiac Rhythm: Normal sinus rhythm (04/18 0742) Resp:  [7-47] 22 (04/18 0803) BP: (119-160)/(74-115) 160/85 (04/18 0750) SpO2:  [90 %-98 %] 90 % (04/18 0803) Arterial Line BP: (114-170)/(67-86) 114/86 (04/18 0030)  Hemodynamic parameters for last 24 hours:    Intake/Output from previous day: 04/17 0701 - 04/18 0700 In: 2500 [I.V.:2500] Out: 1065 [Urine:625; Drains:230; Blood:210] Intake/Output this shift: No intake/output data recorded.  General appearance: alert, cooperative and no distress Neurologic: intact Heart: regular rate and rhythm Lungs: diminished breath sounds bibasilar Abdomen: normal findings: soft, non-tender no air leak, serosanguinous drainage from CT  Lab Results: Recent Labs    03/14/18 1550 03/17/18 0524  WBC 6.7 15.9*  HGB 14.7 12.5*  HCT 41.7 39.6  PLT 215 180   BMET:  Recent Labs    03/14/18 1550  NA 136  K 4.0  CL 104  CO2 20*  GLUCOSE 109*  BUN 16  CREATININE 1.22  CALCIUM 9.4    PT/INR:  Recent Labs    03/14/18 1550  LABPROT 12.1  INR 0.90   ABG    Component Value Date/Time   PHART 7.356 03/17/2018 0447   HCO3 26.3 03/17/2018 0447   O2SAT 86.6 03/17/2018 0447   CBG (last 3)  Recent Labs    03/17/18 0412 03/17/18 0752  GLUCAP 151* 124*    Assessment/Plan: S/P Procedure(s) (LRB): Video Assisted THYMECTOMY. (N/A) POD # 1  CV- stable  RESP- some RLL atelectasis- Right diaphragm may be elevated- add flutter valve, continue IS  RENAL- creatinine OK  ENDO- CBG OK- dc CBG  GI- advance diet  SCD + enoxaparin for DVT prophylaxis  PCA for pain control  CT to water seal  No tumor found in specimen grossly at time of resection, there was a cyst in the  area of the finding on CT. Due to large amount of mediastinal fat will repeat CT this AM to ensure the "mass" was resected     LOS: 1 day    Loreli SlotSteven C Franke Menter 03/17/2018

## 2018-03-17 NOTE — Progress Notes (Signed)
Patient ambulated 21350ft in the hall using a front wheel walker and once in the room, patient was not willing to walk in the hall the second time, he stated that he was hurting, patient encouraged to use his PCA and continue to use his incentive spirometer and flutter valve, bed in lowest position, call bell within reach will continue to monitor.

## 2018-03-17 NOTE — Progress Notes (Signed)
2ml of fentanyl 9540mcg/ml PCA injection wasted by this RN and Marcelene ButteZara Jama RN.

## 2018-03-17 NOTE — Progress Notes (Signed)
Patient dangled on side of bed.  Tolerated well.  VSS with minimal CT output.

## 2018-03-17 NOTE — Care Management Note (Signed)
Case Management Note Donn PieriniKristi Ulice Follett RN, BSN Unit 4E-Case Manager [phone]  Patient Details  Name: Edward JollyJohn S Flemister MRN: [id number] Date of Birth: 1951-06-10  Subjective/Objective:  Pt admitted s/p Video Assisted THYMECTOMY/VATS                  Action/Plan: PTA pt lived at home, independent- anticipate return home. CM to follow for transition of care needs.   Expected Discharge Date:                  Expected Discharge Plan:  Home/Self Care  In-House Referral:     Discharge planning Services  CM Consult  Post Acute Care Choice:    Choice offered to:     DME Arranged:    DME Agency:     HH Arranged:    HH Agency:     Status of Service:  In process, will continue to follow  If discussed at Long Length of Stay Meetings, dates discussed:    Discharge Disposition:   Additional Comments:  Darrold SpanWebster, Takuma Cifelli Hall, RN 03/17/2018, 11:20 AM

## 2018-03-18 ENCOUNTER — Inpatient Hospital Stay (HOSPITAL_COMMUNITY): Payer: PPO

## 2018-03-18 LAB — COMPREHENSIVE METABOLIC PANEL
ALT: 41 U/L (ref 17–63)
AST: 40 U/L (ref 15–41)
Albumin: 3.5 g/dL (ref 3.5–5.0)
Alkaline Phosphatase: 35 U/L — ABNORMAL LOW (ref 38–126)
Anion gap: 9 (ref 5–15)
BUN: 14 mg/dL (ref 6–20)
CO2: 25 mmol/L (ref 22–32)
Calcium: 8.4 mg/dL — ABNORMAL LOW (ref 8.9–10.3)
Chloride: 100 mmol/L — ABNORMAL LOW (ref 101–111)
Creatinine, Ser: 1.15 mg/dL (ref 0.61–1.24)
GFR calc Af Amer: >60 mL/min (ref >60)
GFR calc non Af Amer: >60 mL/min (ref >60)
Glucose, Bld: 120 mg/dL — ABNORMAL HIGH (ref 65–99)
Potassium: 4.4 mmol/L (ref 3.5–5.1)
Sodium: 134 mmol/L — ABNORMAL LOW (ref 135–145)
Total Bilirubin: 1.2 mg/dL (ref 0.3–1.2)
Total Protein: 6.5 g/dL (ref 6.5–8.1)

## 2018-03-18 LAB — CBC
HCT: 38.7 % — ABNORMAL LOW (ref 39.0–52.0)
Hemoglobin: 13.3 g/dL (ref 13.0–17.0)
MCH: 33.5 pg (ref 26.0–34.0)
MCHC: 34.4 g/dL (ref 30.0–36.0)
MCV: 97.5 fL (ref 78.0–100.0)
Platelets: 205 10*3/uL (ref 150–400)
RBC: 3.97 MIL/uL — ABNORMAL LOW (ref 4.22–5.81)
RDW: 13.3 % (ref 11.5–15.5)
WBC: 15.6 10*3/uL — ABNORMAL HIGH (ref 4.0–10.5)

## 2018-03-18 MED ORDER — AMIODARONE HCL IN DEXTROSE 360-4.14 MG/200ML-% IV SOLN
60.0000 mg/h | INTRAVENOUS | Status: AC
  Administered 2018-03-18: 60 mg/h via INTRAVENOUS
  Filled 2018-03-18: qty 200

## 2018-03-18 MED ORDER — AMIODARONE HCL IN DEXTROSE 360-4.14 MG/200ML-% IV SOLN
30.0000 mg/h | INTRAVENOUS | Status: AC
Start: 2018-03-18 — End: 2018-03-18
  Administered 2018-03-18 (×2): 30 mg/h via INTRAVENOUS
  Filled 2018-03-18: qty 200

## 2018-03-18 MED ORDER — POLYETHYLENE GLYCOL 3350 17 G PO PACK
17.0000 g | PACK | Freq: Every day | ORAL | Status: DC
  Administered 2018-03-19: 17 g via ORAL
  Filled 2018-03-18 (×2): qty 1

## 2018-03-18 MED ORDER — AMIODARONE HCL 200 MG PO TABS
400.0000 mg | ORAL_TABLET | Freq: Two times a day (BID) | ORAL | Status: DC
  Administered 2018-03-18 – 2018-03-19 (×2): 400 mg via ORAL
  Filled 2018-03-18 (×2): qty 2

## 2018-03-18 NOTE — Discharge Summary (Addendum)
[address]       [place] [address]             [phone]      Physician Discharge Summary  Patient ID: ALEC JAROS MRN: [id number] DOB/AGE: 66-15-1952 66 y.o.  Admit date: 03/16/2018 Discharge date: 03/19/2018  Admission Diagnoses: Patient Active Problem List   Diagnosis Date Noted  . Mediastinal mass 01/08/2018  . Acid reflux 08/25/2017  . Arthritis 08/25/2017  . HTN (hypertension) 08/25/2017  . HLD (hyperlipidemia) 08/25/2017   Discharge Diagnoses:  THYMIC CYST  Discharged Condition: good  HPI:   Mr. Mckeithan is a 67 year old man with a past history of tobacco abuse, hypertension, hyperlipidemia, hypothyroidism, and coronary and aortic atherosclerosis on CT. He had a low-dose CT scan for screening for lung cancer last fall. He was found to have an anterior mediastinal mass on that scan. I saw him in September and recommended thymectomy. Personal reasons he wanted to wait until after the first of the year to have surgery. I recommended that we do another CT given the delay.  He has not had any new issues in the interim since his last visit. He gets short of breath with heavy exertion but not with any type of routine activities. He is not having any chest pain, pressure, or tightness. He does not have any double vision or weakness.  Hospital Course:  On 03/16/2018  Mr. Matarazzo underwent a bilateral video-assisted thoracoscopy for thymectomy by Dr. Dorris Fetch. The patient tolerated the procedure well and was transferred to the stepdown unit. He was extubated in a timely manner. POD 1 he was having some incisional pain. He did have some RLL atelectasis. We changed the chest tube to water seal since there was no air leak. There was no tumor found in the specimen grossly at the time of resection. There was a cyst in the area. A CT was ordered and found no residual mass. He remained on 3L [place] for oxygen support and we weaned it as indicated. He went into  atrial fibrillation and was started on Amiodarone. He since converted to NSR. We placed his chest tubes on water seal. POD 2 we were able to discontinue the chest tubes since the chest xray was stable without pneumothorax and there was no air leak. Today, the chest xray is stable, the patient is on room air, his incisions are healing well and he is ready for discharge home.   Consults: None  Significant Diagnostic Studies:  CLINICAL DATA:  Postop laminectomy  EXAM: PORTABLE CHEST 1 VIEW  COMPARISON:  03/17/2018  FINDINGS: Elevated right hemidiaphragm unchanged. Bibasilar atelectasis/infiltrate right greater than left unchanged. No pneumothorax or effusion. Bilateral chest tubes remains in place.  IMPRESSION: Right chest tube in place without pneumothorax. No change in moderate right lower lobe atelectasis/infiltrate and mild left lower lobe atelectasis.   Electronically Signed   By: Marlan Palau M.D.   On: 03/18/2018 09:27   Treatments:   NAME:  HAMZEH, TALL NO.:  [id number]  MEDICAL RECORD NO.:  [id number]  LOCATION:                                 FACILITY:  PHYSICIAN:  Salvatore Decent. Dorris Fetch, M.D. DATE OF BIRTH:  DATE OF PROCEDURE:  03/16/2018 DATE OF DISCHARGE:  OPERATIVE REPORT   PREOPERATIVE DIAGNOSIS:  Anterior mediastinal mass.  POSTOPERATIVE DIAGNOSIS:  Thymic cyst.  PROCEDURE:  Bilateral video-assisted thoracoscopy for thymectomy.  SURGEON:  Salvatore DecentSteven C. Dorris FetchHendrickson, M.D.  ASSISTANTS:  Jari Favreessa Conte and Doree Fudgeonielle Zimmerman, [place].  ANESTHESIA:  General.  FINDINGS:  Large mediastinal fat pad.  Phrenic nerve was identified and preserved.  Cyst in the anterior mediastinal fat pad with murky fluid. No definite mass identified.    Discharge Exam: Blood pressure (!) 153/87, pulse 86, temperature 98.4 F (36.9 C), temperature source Oral, resp. rate 17, height 5\' 9"  (1.753 m), SpO2 99 %.    General appearance: alert, cooperative and no distress Resp: clear to auscultation bilaterally Cardio: regular rate and rhythm GI: soft, non-tender; bowel sounds normal; no masses,  no organomegaly Neurologic: Grossly normal Incision/Wound:clean and dry  Disposition: Home   Allergies as of 03/19/2018   No Known Allergies     Medication List    TAKE these medications   acetaminophen 500 MG tablet Commonly known as:  TYLENOL Take 2 tablets (1,000 mg total) by mouth every 6 (six) hours as needed. What changed:    how much to take  when to take this  reasons to take this   amiodarone 400 MG tablet Commonly known as:  PACERONE Take 1 tablet (400 mg total) by mouth 2 (two) times daily. X 7 days, then decrease to 200 mg (1/2 tab) by mouth 2 times daily   atorvastatin 40 MG tablet Commonly known as:  LIPITOR Take 40 mg by mouth at bedtime.   levothyroxine 50 MCG tablet Commonly known as:  SYNTHROID, LEVOTHROID Take 50 mcg by mouth daily.   lisinopril 20 MG tablet Commonly known as:  PRINIVIL,ZESTRIL Take 20 mg by mouth daily.   Melatonin 5 MG Tabs Take 5 mg by mouth at bedtime.   multivitamin with minerals Tabs tablet Take 1 tablet by mouth daily.   omeprazole 20 MG capsule Commonly known as:  PRILOSEC Take 20 mg by mouth daily.   oxyCODONE 5 MG immediate release tablet Commonly known as:  Oxy IR/ROXICODONE Take 1 tablet (5 mg total) by mouth every 4 (four) hours as needed for severe pain.   sertraline 50 MG tablet Commonly known as:  ZOLOFT Take 50 mg by mouth daily.        Signed: Lowella Dandyrin Barrett 03/19/2018, 7:31 AM

## 2018-03-18 NOTE — Discharge Instructions (Signed)
Video-Assisted Thoracic Surgery, Care After ° °This sheet gives you information about how to care for yourself after your procedure. Your health care provider may also give you more specific instructions. If you have problems or questions, contact your health care provider. °What can I expect after the procedure? °After the procedure, it is common to have: °· Some pain and soreness in your chest. °· Pain when breathing in (inhaling) and coughing. °· Constipation. °· Fatigue. °· Difficulty sleeping. ° °Follow these instructions at home: °Preventing pneumonia °· Take deep breaths or do breathing exercises as instructed by your health care provider. Doing this helps prevent lung infection (pneumonia). °· Cough frequently. Coughing may cause discomfort, but it is important to clear mucus (phlegm) and expand your lungs. If it hurts to cough, hold a pillow against your chest or place the palms of both hands on top of the incision (use splinting) when you cough. This may help relieve discomfort. °· If you were given an incentive spirometer, use it as directed. An incentive spirometer is a tool that measures how well you are filling your lungs with each breath. °· Participate in pulmonary rehabilitation as directed by your health care provider. This is a program that combines education, exercise, and support from a team of specialists. The goal is to help you heal and get back to your normal activities as soon as possible. °Medicines °· Take over-the-counter or prescription medicines only as told by your health care provider. °· If you have pain, take pain-relieving medicine before your pain becomes severe. This is important because if your pain is under control, you will be able to breathe and cough more comfortably. °· If you were prescribed an antibiotic medicine, take it as told by your health care provider. Do not stop taking the antibiotic even if you start to feel better. °Activity °· Ask your health care provider  what activities are safe for you. °· Avoid activities that use your chest muscles for at least 3-4 weeks. °· Do not lift anything that is heavier than 10 lb (4.5 kg), or the limit that your health care provider tells you, until he or she says that it is safe. °Incision care °· Follow instructions from your health care provider about how to take care of your incision(s). Make sure you: °? Wash your hands with soap and water before you change your bandage (dressing). If soap and water are not available, use hand sanitizer. °? Change your dressing as told by your health care provider. °? Leave stitches (sutures), skin glue, or adhesive strips in place. These skin closures may need to stay in place for 2 weeks or longer. If adhesive strip edges start to loosen and curl up, you may trim the loose edges. Do not remove adhesive strips completely unless your health care provider tells you to do that. °· Keep your dressing dry until it has been removed. °· Check your incision area every day for signs of infection. Check for: °? Redness, swelling, or pain. °? Fluid or blood. °? Warmth. °? Pus or a bad smell. °Bathing °· Do not take baths, swim, or use a hot tub until your health care provider approves. You may take showers. °· After your dressing has been removed, use soap and water to gently wash your incision area. Do not use anything else to clean your incision(s) unless your health care provider tells you to do this. °Driving °· Do not drive until your health care provider approves. °· Do not drive   or use heavy machinery while taking prescription pain medicine. °Eating and drinking °· Eat a healthy, balanced diet as instructed by your health care provider. A healthy diet includes plenty of fresh fruits and vegetables, whole grains, and low-fat (lean) proteins. °· Limit foods that are high in fat and processed sugars, such as fried and sweet foods. °· Drink enough fluid to keep your urine clear or pale yellow. °General  instructions °· To prevent or treat constipation while you are taking prescription pain medicine, your health care provider may recommend that you: °? Take over-the-counter or prescription medicines. °? Eat foods that are high in fiber, such as beans, fresh fruits and vegetables, and whole grains. °· Do not use any products that contain nicotine or tobacco, such as cigarettes and e-cigarettes. If you need help quitting, ask your health care provider. °· Avoid secondhand smoke. °· Wear compression stockings as told by your health care provider. These stockings help to prevent blood clots and reduce swelling in your legs. °· If you have a chest tube, care for it as instructed by your health care provider. Do not travel by airplane during the 2 weeks after your chest tube is removed, or until your health care provider says that this is safe. °· Keep all follow-up visits as told by your health care provider. This is important. °Contact a health care provider if: °· You have redness, swelling, or pain around an incision. °· You have fluid or blood coming from an incision. °· Your incision area feels warm to the touch. °· You have pus or a bad smell coming from an incision. °· You have a fever or chills. °· You have nausea or vomiting. °· You have pain that does not get better with medicine. °Get help right away if: °· You have chest pain. °· Your heart is fluttering or beating rapidly. °· You develop a rash. °· You have shortness of breath or trouble breathing. °· You are confused. °· You have trouble speaking. °· You feel weak, light-headed, or dizzy. °· You faint. °Summary °· To help prevent lung infection (pneumonia), take deep breaths or do breathing exercises as instructed by your health care provider. °· Cough frequently to clear mucus (phlegm) and expand your lungs. If it hurts to cough, hold a pillow against your chest or place the palms of both hands on top of the incision (use splinting) when you cough. °· If  you have pain, take pain-relieving medicine before your pain becomes severe. This is important because if your pain is under control, you will be able to breathe and cough more comfortably. °· Ask your health care provider what activities are safe for you. °This information is not intended to replace advice given to you by your health care provider. Make sure you discuss any questions you have with your health care provider. °Document Released: 03/13/2013 Document Revised: 10/26/2016 Document Reviewed: 10/26/2016 °Elsevier Interactive Patient Education © 2017 [employer] Inc[employer] ° °

## 2018-03-18 NOTE — Progress Notes (Signed)
Patient ambulated 64540ft in the hall using a front wheel walker on 4L O2, patient maintained O2 sat of 93% during ambulation, ambulation well tolerated, patient educated on the need to keep ambulating, using incentive spirometer and flutter valve , he verbalized understanding will continue to monitor.

## 2018-03-18 NOTE — Consult Note (Signed)
[facility] CM Primary Care Navigator  03/18/2018  Mario Hill 05/20/1951 [id number]  Went to see patient at the bedside to identify possible discharge needs but RNs are in the room providing patient care at this moment.  Will see patient at another time when patient is available in the room.    Addendum:  Went back to see patient at the bedsideto identify possible discharge needs. Met with wife (Loiuse) who arrived towards the end of this visit. Patientreportsthat hewas seen by his primary care provider for routine check and work up- had a CT scan for screening for lung cancer last fall and was found to have an anterior mediastinal mass (incidental finding). He was referred to pulmonologist which had led to this admission/ surgery (underwent thymectomy).  PatientendorsesDr.William Shaw with [facility] asthe primary care provider.   [facility] [place] [facility] [address] [facility]y.   Patientstates managinghisown medications at home with use of "pill box" system filled every week.  Patient reports thathe has been driving prior to admission/ surgery but wife will providetransportation to his doctors'appointments after discharge.  Patient lives at home with wife who will serve as his primary caregiver when discharge.  Anticipated discharge plan ishomeper patient.  Patient voiced understanding to call primary care provider's office whenhereturns home,for a post discharge follow-up appointment within1- 2 weeksor sooner if needs arise.Patient letter (with PCP's contact number) was provided asareminder.  Explained topatientregardingTHN CM services available for health managementat homebuthedeniesanyneeds or concernsat thistime.  Patientexpressed understandingof need to seek referral from primary care provider to [facility] care management  ifnecessaryand deemed appropriate for anyservices in the future.  [facility] care management information provided for future needs thathemay have.  Patienthowever,verbally agreedand optedforEMMIcalls tofollowup hisrecoveryat home.   Referral made for EMMI General calls after discharge.   For additional questions please contact:  Lorraine A. Ajel, BSN, RN-BC [facility] PRIMARY CARE Navigator Cell: [phone] 

## 2018-03-18 NOTE — Progress Notes (Addendum)
      [address]       [place] [id number]             [phone]      2 Days Post-Op Procedure(s) (LRB): Video Assisted THYMECTOMY. (N/A) Subjective: Feels okay this morning. Plans to walk in the halls.   Objective: Vital signs in last 24 hours: Temp:  [97.5 F (36.4 C)-98.4 F (36.9 C)] 98.3 F (36.8 C) (04/19 0715) Pulse Rate:  [40-109] 91 (04/19 0715) Cardiac Rhythm: Normal sinus rhythm (04/19 0455) Resp:  [9-24] 14 (04/19 0733) BP: (122-160)/(70-96) 132/72 (04/19 0715) SpO2:  [89 %-98 %] 96 % (04/19 0733)     Intake/Output from previous day: 04/18 0701 - 04/19 0700 In: 2615 [P.O.:960; I.V.:1655] Out: 410 [Drains:410] Intake/Output this shift: Total I/O In: -  Out: 120 [Urine:120]  General appearance: alert, cooperative and no distress Heart: regular rate and rhythm, S1, S2 normal, no murmur, click, rub or gallop Lungs: clear to auscultation bilaterally Abdomen: firm Extremities: extremities normal, atraumatic, no cyanosis or edema Wound: clean and dry incisions.   Lab Results: Recent Labs    03/17/18 0524 03/18/18 0216  WBC 15.9* 15.6*  HGB 12.5* 13.3  HCT 39.6 38.7*  PLT 180 205   BMET:  Recent Labs    03/17/18 1236 03/18/18 0216  NA 134* 134*  K 4.3 4.4  CL 100* 100*  CO2 24 25  GLUCOSE 141* 120*  BUN 16 14  CREATININE 1.27* 1.15  CALCIUM 8.5* 8.4*    PT/INR: No results for input(s): LABPROT, INR in the last 72 hours. ABG    Component Value Date/Time   PHART 7.356 03/17/2018 0447   HCO3 26.3 03/17/2018 0447   O2SAT 86.6 03/17/2018 0447   CBG (last 3)  Recent Labs    03/17/18 0412 03/17/18 0752  GLUCAP 151* 124*    Assessment/Plan: S/P Procedure(s) (LRB): Video Assisted THYMECTOMY. (N/A)  1. CV-NSR in the 80s-90s. BP well controlled. On Amio IV, went into atrial fibrillation around 4am. Continue statin.  2. Pulm-tolerating 3L [place] with good oxygen levels. Wean as able. Encouraged use of incentive  spirometer. CT of the chest showed no residual anterior mediastinal mass. CT to water seal with no obvious pneumo on CXR. No air leak.  3. Renal-creatinine 1.15. Electrolytes okay.  4. H and H okay at 13.3/38.7, platelets 205k 5. Endo-blood glucose level well controlled.  6. Lovenox for DVT prophylaxis  7. Constipation- will add miralax.   Plan: Probably can remove one or both chest tubes today. No pneumothorax on CXR and no air leak on water seal.    LOS: 2 days    Sharlene Doryessa N Conte 03/18/2018 Path- benign thymic cyst Right hemidiaphragm is elevated, should recover as phrenic nerve was preserved DC pCA Increase ambulation Postoperative atrial fibrillation, transient- Convert amiodarone to PO this evening  Viviann SpareSteven C. Dorris FetchHendrickson, MD [facility] [phone]

## 2018-03-18 NOTE — Progress Notes (Signed)
4ml of fentanyl 4740mcg/ml PCA wasted in the sink by this RN and Sales executiveAndrea Smithheart RN

## 2018-03-18 NOTE — Progress Notes (Signed)
Pt ambulated the 3 rd time for the day 470 ft in the hall without oxygen, tolerated well. We'll continue to monitor.

## 2018-03-19 ENCOUNTER — Inpatient Hospital Stay (HOSPITAL_COMMUNITY): Payer: PPO

## 2018-03-19 MED ORDER — AMIODARONE HCL 400 MG PO TABS: 400.0000 mg | ORAL_TABLET | Freq: Two times a day (BID) | ORAL | 1 refills | Status: DC

## 2018-03-19 MED ORDER — ACETAMINOPHEN 500 MG PO TABS: 1000.0000 mg | ORAL_TABLET | Freq: Four times a day (QID) | ORAL | 0 refills | Status: DC | PRN

## 2018-03-19 MED ORDER — OXYCODONE HCL 5 MG PO TABS: 5.0000 mg | ORAL_TABLET | ORAL | 0 refills | Status: DC | PRN

## 2018-03-19 MED ORDER — LACTULOSE 10 GM/15ML PO SOLN
20.0000 g | Freq: Once | ORAL | Status: AC
  Administered 2018-03-19: 20 g via ORAL
  Filled 2018-03-19: qty 30

## 2018-03-19 NOTE — Progress Notes (Addendum)
      [address]       [place] [id number]             [phone]      3 Days Post-Op Procedure(s) (LRB): Video Assisted THYMECTOMY. (N/A)   Subjective:  Patient states he feels really good.  States he slept well.  Has not yet moved his bowel but is passing gas.  Objective: Vital signs in last 24 hours: Temp:  [97.8 F (36.6 C)-98.8 F (37.1 C)] 98.4 F (36.9 C) (04/20 0501) Pulse Rate:  [80-114] 86 (04/20 0501) Cardiac Rhythm: Normal sinus rhythm (04/20 0019) Resp:  [12-28] 17 (04/20 0501) BP: (127-167)/(76-88) 153/87 (04/20 0501) SpO2:  [76 %-99 %] 99 % (04/20 0501)  Intake/Output from previous day: 04/19 0701 - 04/20 0700 In: 3753.6 [P.O.:1280; I.V.:2473.6] Out: 1650 [Urine:1470; Drains:180]  General appearance: alert, cooperative and no distress Heart: regular rate and rhythm Lungs: clear to auscultation bilaterally Abdomen: soft, non-tender; bowel sounds normal; no masses,  no organomegaly Extremities: extremities normal, atraumatic, no cyanosis or edema Wound: clean and dry  Lab Results: Recent Labs    03/17/18 0524 03/18/18 0216  WBC 15.9* 15.6*  HGB 12.5* 13.3  HCT 39.6 38.7*  PLT 180 205   BMET:  Recent Labs    03/17/18 1236 03/18/18 0216  NA 134* 134*  K 4.3 4.4  CL 100* 100*  CO2 24 25  GLUCOSE 141* 120*  BUN 16 14  CREATININE 1.27* 1.15  CALCIUM 8.5* 8.4*    PT/INR: No results for input(s): LABPROT, INR in the last 72 hours. ABG    Component Value Date/Time   PHART 7.356 03/17/2018 0447   HCO3 26.3 03/17/2018 0447   O2SAT 86.6 03/17/2018 0447   CBG (last 3)  Recent Labs    03/17/18 0412 03/17/18 0752  GLUCAP 151* 124*    Assessment/Plan: S/P Procedure(s) (LRB): Video Assisted THYMECTOMY. (N/A)  1. CV- PAF, maintaining NSR- continue Amiodarone 2. Pulm- off oxygen, continue IS... CXR with persistent elevation of right hemidiaphragm, no pneumothorax 3. GI- remains constipated, passing gas, will give  Lactulose today 4. Dispo- patient stable,  will d/c home today   LOS: 3 days    Lowella Dandyrin Pailynn Vahey 03/19/2018

## 2018-04-03 IMAGING — CT CT CHEST LUNG CANCER SCREENING LOW DOSE W/O CM
1 of 5 series · 15 of 40 positions shown, 19 images · non-contrast
Comparison: None.

CLINICAL DATA: 66-year-old male former smoker, quit 10 years ago,
with 40 pack-year history of smoking, for initial lung cancer
screening

EXAM:
CT CHEST WITHOUT CONTRAST LOW-DOSE FOR LUNG CANCER SCREENING
TECHNIQUE: Multidetector CT imaging of the chest was performed following the
standard protocol without IV contrast.

[Series 3: lung windows · axial · 0.84mm/px · z∈[-355,-68]mm · 15 of 257 slices shown, 19 images]
[im 14/257  mediastinal]
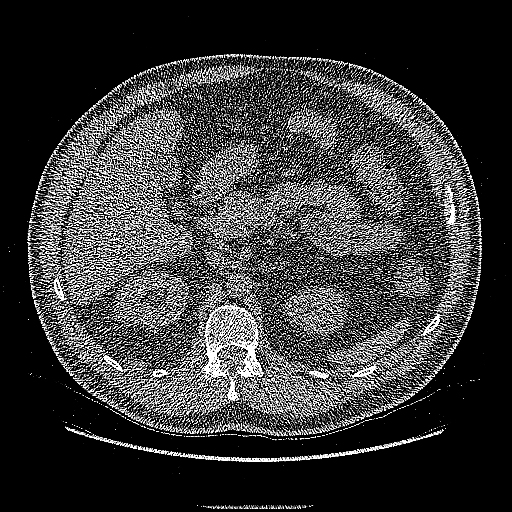
[im 14/257  lung]
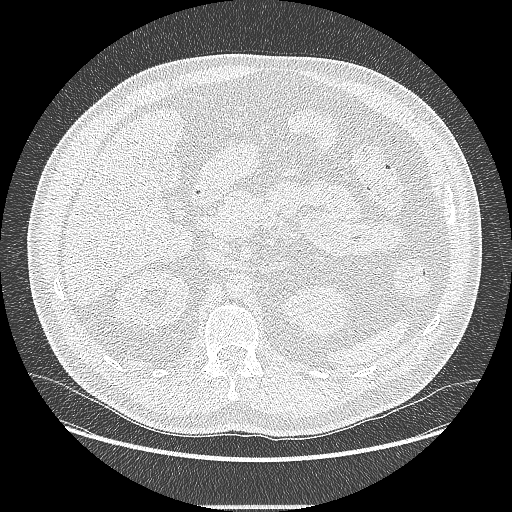
[im 27/257  lung]
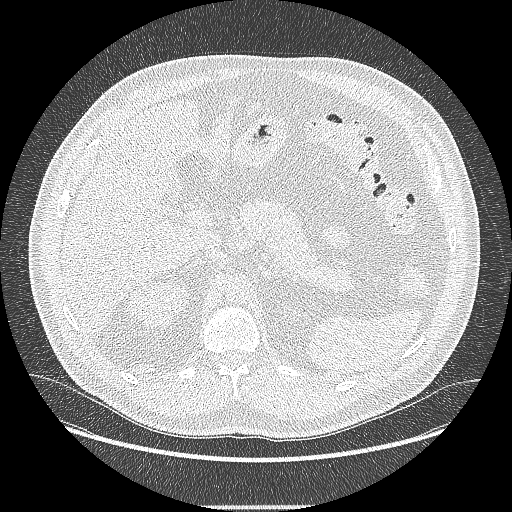
[im 54/257  lung]
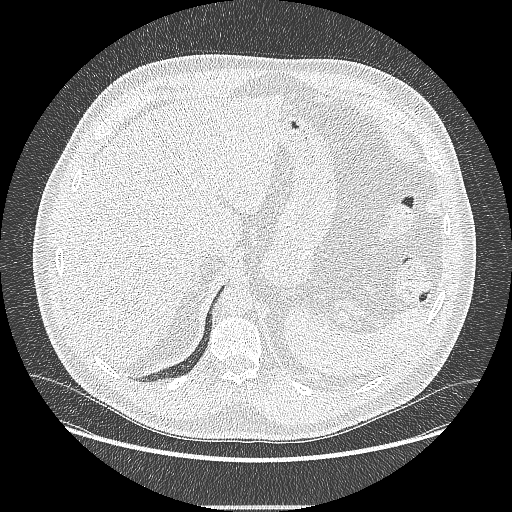
[im 68/257  lung]
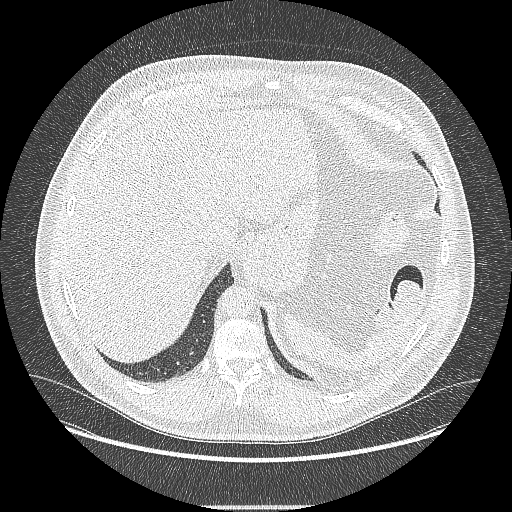
[im 81/257  mediastinal]
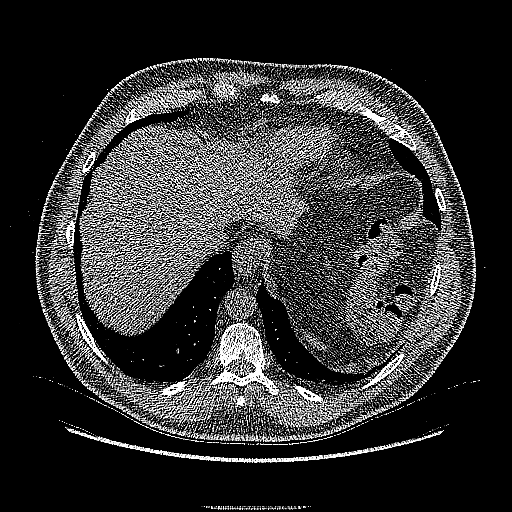
[im 81/257  lung]
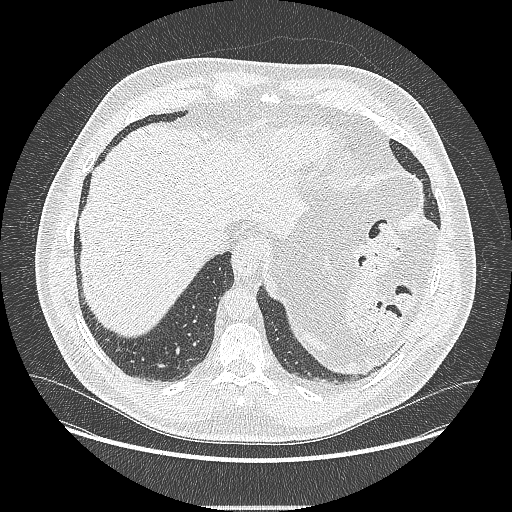
[im 95/257  lung]
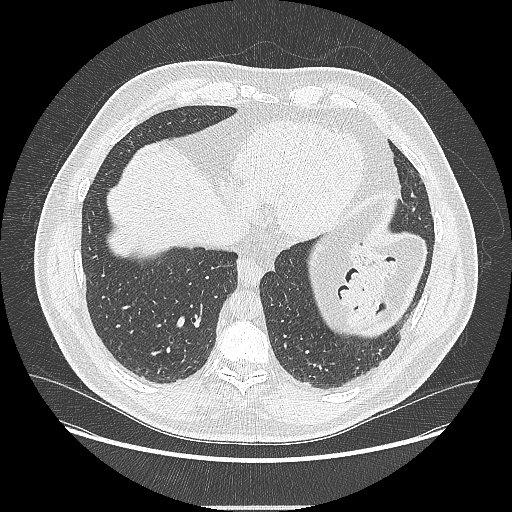
[im 108/257  lung]
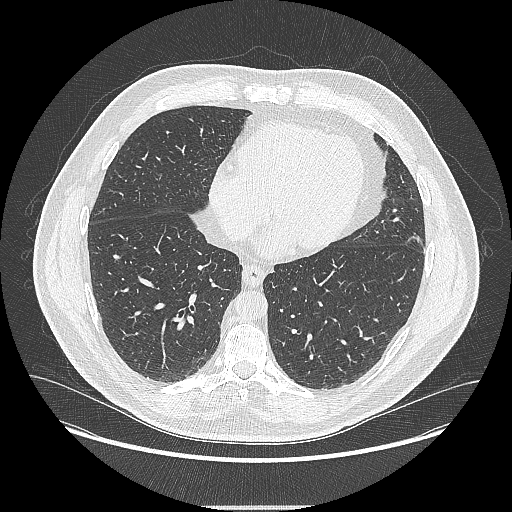
[im 135/257  lung]
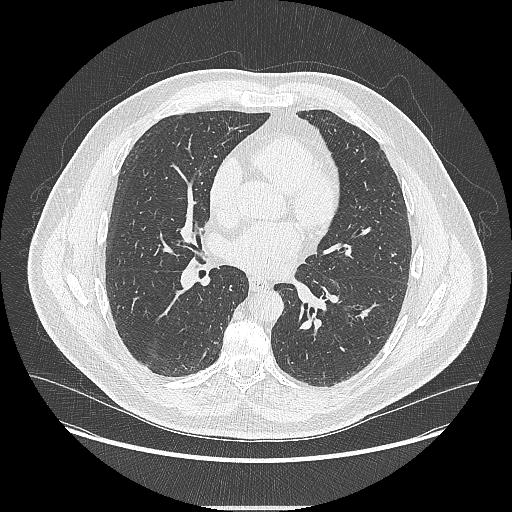
[im 149/257  mediastinal]
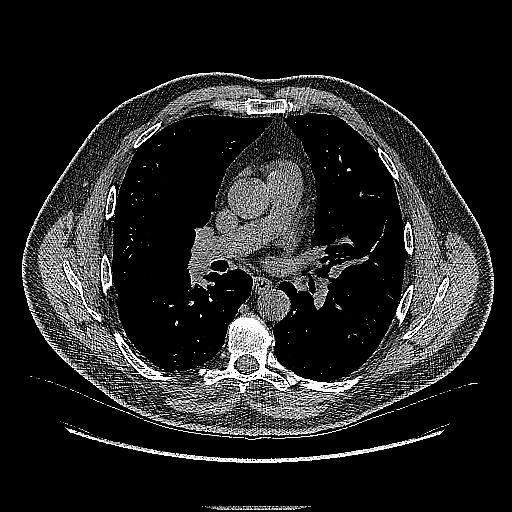
[im 149/257  lung]
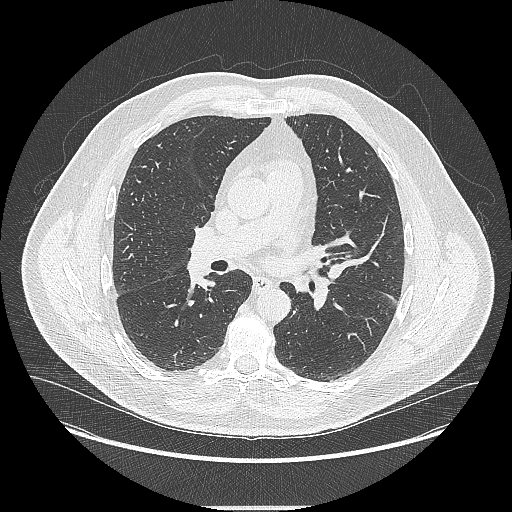
[im 162/257  lung]
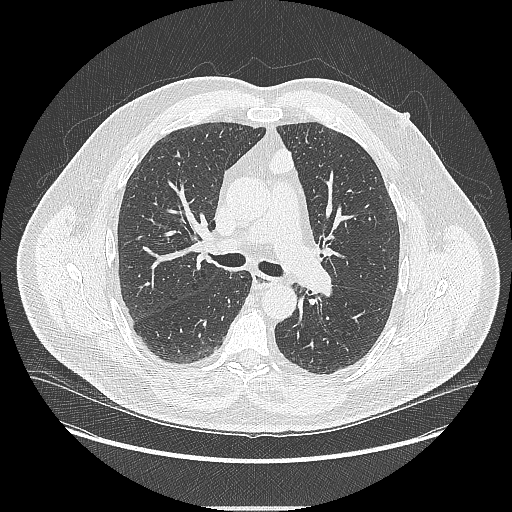
[im 176/257  lung]
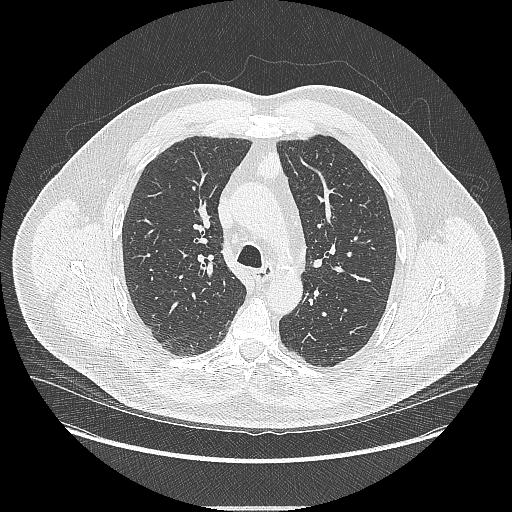
[im 189/257  lung]
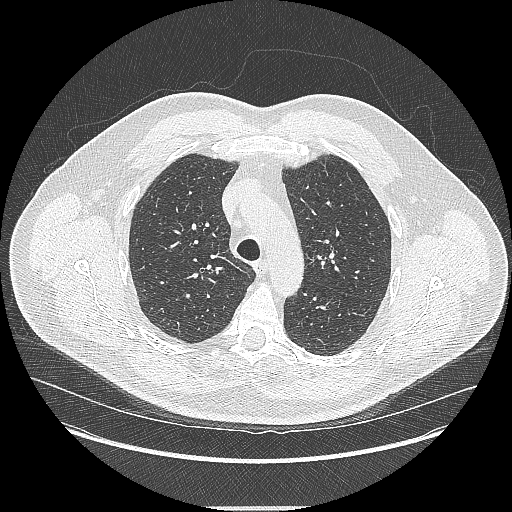
[im 216/257  mediastinal]
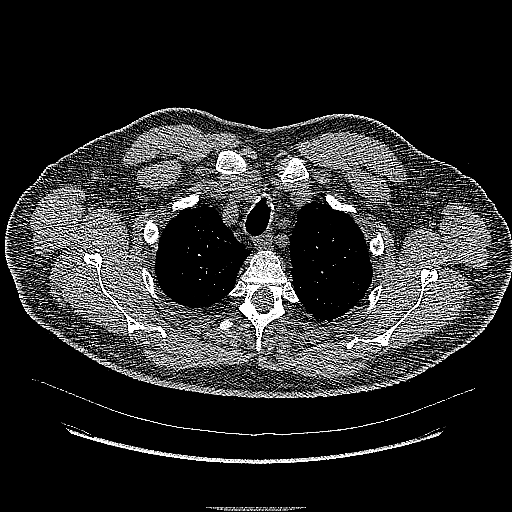
[im 216/257  lung]
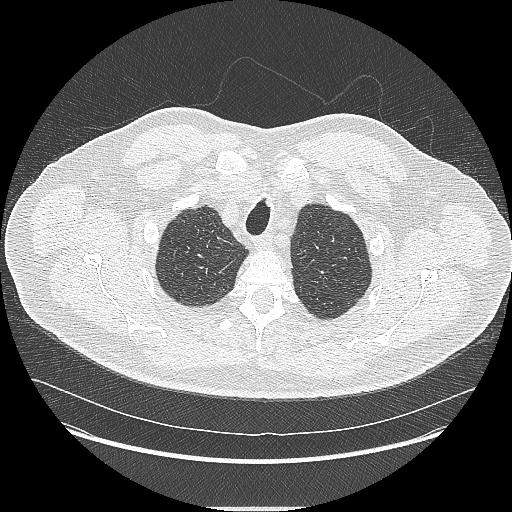
[im 230/257  lung]
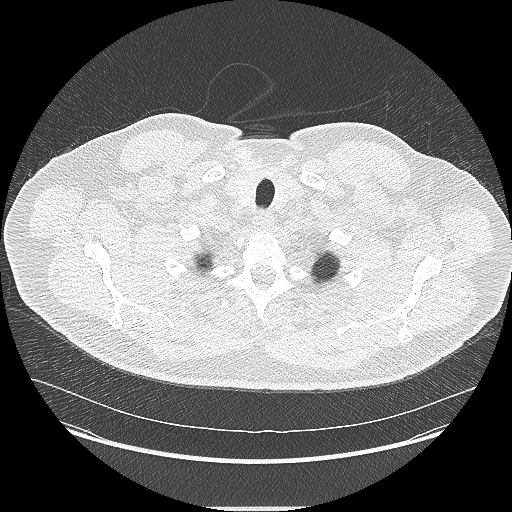
[im 243/257  lung]
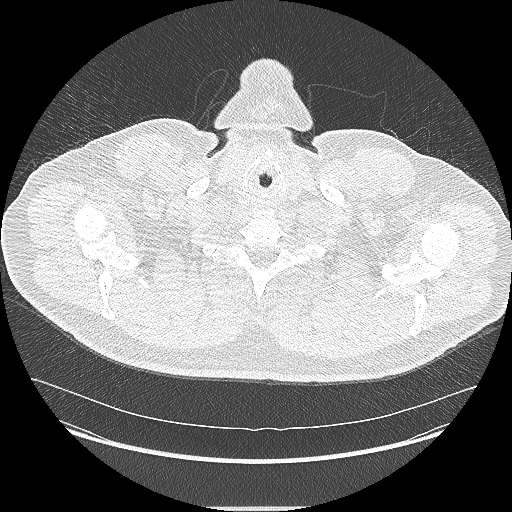

[15 of 40 positions shown; findings below may reference images not displayed]

FINDINGS: Cardiovascular: The heart is normal in size. No pericardial
effusion.

No evidence of thoracic aortic aneurysm. Mild atherosclerotic
calcifications of the aortic arch.

Three vessel coronary atherosclerosis.

Mediastinum/Nodes: 2.3 x 1.9 x 2.9 cm triangular prevascular soft
tissue lesion (series 2/ image 22), favoring a thymoma, less likely
an enlarged prevascular lymph node.

Otherwise, no suspicious mediastinal lymphadenopathy.

Visualized thyroid is unremarkable.

Lungs/Pleura: Mild centrilobular emphysematous changes, upper lobe
predominant.

No focal consolidation.

2.6 mm subpleural granuloma in the posterior right lower lobe,
benign.

No pleural effusion or pneumothorax.

Upper Abdomen: Visualized upper abdomen is notable for mild hepatic
steatosis.

Musculoskeletal: Mild degenerative changes of the visualized
thoracolumbar spine.
IMPRESSION: Lung-RADS 1S, negative. Continue annual screening with low-dose
chest CT without contrast in 12 months.

A Lung-RADS "S" modifier is used due to the presence of a
potentially clinically significant finding, in this case, 2.9 cm
triangular prevascular soft tissue lesion, favoring a thymoma, less
likely an enlarged prevascular lymph node. Thoracic surgery
consultation is suggested..

Aortic Atherosclerosis (FT9LZ-IYI.I) and Emphysema (FT9LZ-OAI.Q).

## 2018-04-04 ENCOUNTER — Other Ambulatory Visit: Payer: Self-pay | Admitting: Thoracic Surgery (Cardiothoracic Vascular Surgery)

## 2018-04-04 DIAGNOSIS — J9859 Other diseases of mediastinum, not elsewhere classified: Secondary | ICD-10-CM

## 2018-04-05 ENCOUNTER — Other Ambulatory Visit: Payer: Self-pay

## 2018-04-05 ENCOUNTER — Encounter: Payer: Self-pay | Admitting: Thoracic Surgery (Cardiothoracic Vascular Surgery)

## 2018-04-05 ENCOUNTER — Ambulatory Visit
Admission: RE | Admit: 2018-04-05 | Discharge: 2018-04-05 | Disposition: A | Discharge status: Home / Self Care | Payer: PPO | Source: Ambulatory Visit | Attending: Thoracic Surgery (Cardiothoracic Vascular Surgery) | Admitting: Thoracic Surgery (Cardiothoracic Vascular Surgery)

## 2018-04-05 ENCOUNTER — Ambulatory Visit (INDEPENDENT_AMBULATORY_CARE_PROVIDER_SITE_OTHER): Payer: Self-pay | Admitting: Thoracic Surgery (Cardiothoracic Vascular Surgery)

## 2018-04-05 VITALS — BP 121/77 | HR 77 | Resp 16 | Ht 68.0 in | Wt 209.4 lb

## 2018-04-05 DIAGNOSIS — Z09 Encounter for follow-up examination after completed treatment for conditions other than malignant neoplasm: Secondary | ICD-10-CM

## 2018-04-05 DIAGNOSIS — J9859 Other diseases of mediastinum, not elsewhere classified: Secondary | ICD-10-CM

## 2018-04-05 DIAGNOSIS — R222 Localized swelling, mass and lump, trunk: Secondary | ICD-10-CM | POA: Diagnosis not present

## 2018-04-05 NOTE — Progress Notes (Signed)
[address]       [place] [address]             [phone]    HPI: Mario Hill returns for scheduled follow-up visit  Mario Hill is a 67 year old man with a history of tobacco use who had a low dose screening CT for cancer last fall. He was found to have an anterior mediastinal "mass." He was agreeable to surgery but wanted to wait until 2019.  Repeat CT showed no change in the mass.  I did a bilateral VATS thymectomy on 03/16/2018.  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  He had atrial fibrillation in the early postoperative period.  He converted back to sinus rhythm with amiodarone.  He was discharged on amiodarone 400 mg twice daily.  He had an elevated right hemidiaphragm postoperatively.  He went home on day 3.  About a week ago he had rather severe sharp stabbing pain substernally.  He thought maybe it was due to the amiodarone and held that medication.  His pain improved after that.  He has started retaking the amiodarone.  He has some minimal soreness in the chest bilaterally.  He has not taken any narcotics for the past week  Past Medical History:  Diagnosis Date  . Arthritis   . Dysrhythmia   . GERD (gastroesophageal reflux disease)   . Hyperlipidemia   . Hypertension   . Hypothyroidism   . Mediastinal mass     Current Outpatient Medications  Medication Sig Dispense Refill  . atorvastatin (LIPITOR) 40 MG tablet Take 40 mg by mouth at bedtime.  3  . levothyroxine (SYNTHROID, LEVOTHROID) 50 MCG tablet Take 50 mcg by mouth daily.  6  . lisinopril (PRINIVIL,ZESTRIL) 20 MG tablet Take 20 mg by mouth daily.  6  . Melatonin 5 MG TABS Take 5 mg by mouth at bedtime.    . Multiple Vitamin (MULTIVITAMIN WITH MINERALS) TABS tablet Take 1 tablet by mouth daily.    Marland Kitchen omeprazole (PRILOSEC) 20 MG capsule Take 20 mg by mouth daily.  3  . sertraline (ZOLOFT) 50 MG tablet Take 50 mg by mouth daily.  5   No current facility-administered  medications for this visit.     Physical Exam BP 121/77 (BP Location: Right Arm, Patient Position: Sitting, Cuff Size: Large)   Pulse 77   Resp 16   Ht  (1.727 m)   Wt 209 lb 6.4 oz (95 kg)   SpO2 98% Comment: ON RA  BMI 31.57 kg/m  67 year old man in no acute distress Alert and oriented x3 with no focal deficits Lungs diminished breath sounds right base, otherwise clear Incisions healing well Cardiac regular rate and rhythm no rubs or murmurs  Diagnostic Tests: CHEST - 2 VIEW  COMPARISON:  Radiographs of March 19, 2018.  FINDINGS: The heart size and mediastinal contours are within normal limits. No pneumothorax is noted. Left lung is clear. Elevated right hemidiaphragm is noted. Mild right basilar subsegmental atelectasis with probable minimal pleural effusion is noted. The visualized skeletal structures are unremarkable.  IMPRESSION: Elevated right hemidiaphragm with mild right basilar subsegmental atelectasis and minimal right pleural effusion.   Electronically Signed   By: Lupita Raider, M.D.   On: 04/05/2018 13:35 I personally reviewed the CXR images and concur with the findings noted above.  Impression: Mario Hill is a 67 year old man with a past history of tobacco use who had an  anterior mediastinal "mass" found on a low-dose screening CT.  That was thought to be a thymoma.  He underwent a thymectomy via bilateral VATS approach about 3 weeks ago.  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  Elevated right hemidiaphragm-right phrenic nerve dysfunction.  Surprising given that that nerve was easily identified and avoided during the dissection.  I suspect that will recover over time but could take up to a year.  Postoperative atrial fibrillation-brief episode converted back to a sinus rhythm.  He was discharged on amiodarone.  He is only 3 weeks out but thinks he may be having side effects from the amiodarone and would like to discontinue the  medication.  We will go ahead and do so.  Plan: I will plan to see him back in 2 months with a PA and lateral chest x-ray to check on his progress  Loreli Slot, MD [facility] [phone]

## 2018-06-07 ENCOUNTER — Encounter: Payer: PPO | Admitting: Thoracic Surgery (Cardiothoracic Vascular Surgery)

## 2018-06-13 ENCOUNTER — Other Ambulatory Visit: Payer: Self-pay | Admitting: Thoracic Surgery (Cardiothoracic Vascular Surgery)

## 2018-06-13 DIAGNOSIS — J9859 Other diseases of mediastinum, not elsewhere classified: Secondary | ICD-10-CM

## 2018-06-14 ENCOUNTER — Encounter: Payer: Self-pay | Admitting: Thoracic Surgery (Cardiothoracic Vascular Surgery)

## 2018-06-14 ENCOUNTER — Ambulatory Visit (INDEPENDENT_AMBULATORY_CARE_PROVIDER_SITE_OTHER): Payer: Self-pay | Admitting: Thoracic Surgery (Cardiothoracic Vascular Surgery)

## 2018-06-14 ENCOUNTER — Ambulatory Visit
Admission: RE | Admit: 2018-06-14 | Discharge: 2018-06-14 | Disposition: A | Discharge status: Home / Self Care | Payer: PPO | Source: Ambulatory Visit | Attending: Thoracic Surgery (Cardiothoracic Vascular Surgery) | Admitting: Thoracic Surgery (Cardiothoracic Vascular Surgery)

## 2018-06-14 ENCOUNTER — Other Ambulatory Visit: Payer: Self-pay

## 2018-06-14 VITALS — BP 122/77 | HR 72 | Resp 16 | Ht 68.0 in | Wt 199.0 lb

## 2018-06-14 DIAGNOSIS — Z09 Encounter for follow-up examination after completed treatment for conditions other than malignant neoplasm: Secondary | ICD-10-CM

## 2018-06-14 DIAGNOSIS — E328 Other diseases of thymus: Secondary | ICD-10-CM

## 2018-06-14 DIAGNOSIS — J9859 Other diseases of mediastinum, not elsewhere classified: Secondary | ICD-10-CM

## 2018-06-14 DIAGNOSIS — J9811 Atelectasis: Secondary | ICD-10-CM | POA: Diagnosis not present

## 2018-06-14 NOTE — Progress Notes (Signed)
[address]       [place] [id number]             [phone]    HPI: Mr. Mario Hill returns for scheduled postoperative follow-up visit  Alroy DustJohn Botts is a 67 year old man with a past history of tobacco use and obesity who had an anterior mediastinal mass found on a low-dose screening CT.  I did a thymectomy via a bilateral VATS approach on 03/16/2018.  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  He had atrial fibrillation in the early postoperative period.  He was having side effects from the amiodarone to be stopped at his last visit.  He feels well.  He has not had any irregular heart rhythms or palpitations.  His exercise tolerance is good.  He is walking a mile and a half a day.  He has lost a total of 30 pounds since his surgery.  His appetite is good.  He is not having any shortness of breath or orthopnea.  Past Medical History:  Diagnosis Date  . Arthritis   . Dysrhythmia   . GERD (gastroesophageal reflux disease)   . Hyperlipidemia   . Hypertension   . Hypothyroidism   . Mediastinal mass     Current Outpatient Medications  Medication Sig Dispense Refill  . atorvastatin (LIPITOR) 40 MG tablet Take 40 mg by mouth at bedtime.  3  . levothyroxine (SYNTHROID, LEVOTHROID) 50 MCG tablet Take 50 mcg by mouth daily.  6  . lisinopril (PRINIVIL,ZESTRIL) 20 MG tablet Take 20 mg by mouth daily.  6  . Melatonin 5 MG TABS Take 5 mg by mouth at bedtime.    . Multiple Vitamin (MULTIVITAMIN WITH MINERALS) TABS tablet Take 1 tablet by mouth daily.    . sertraline (ZOLOFT) 50 MG tablet Take 50 mg by mouth daily.  5  . omeprazole (PRILOSEC) 20 MG capsule Take 20 mg by mouth daily.  3   No current facility-administered medications for this visit.     Physical Exam BP 122/77 (BP Location: Right Arm, Patient Position: Sitting, Cuff Size: Large)   Pulse 72   Resp 16   Ht 5\' 8"  (1.727 m)   Wt 199 lb (90.3 kg)   SpO2 97% Comment: ON RA  BMI 30.1426 kg/m  67 year old man  in no acute distress Alert and oriented x3 with no focal deficits Lungs absent breath sounds right base, otherwise clear Cardiac regular rate and rhythm normal S1-S2 Incisions well-healed  Diagnostic Tests: CHEST - 2 VIEW  COMPARISON:  Chest x-ray dated Apr 05, 2018.  FINDINGS: The heart size and mediastinal contours are within normal limits. Normal pulmonary vascularity. Unchanged elevation of the right hemidiaphragm with associated right lower lobe atelectasis. No focal consolidation, pleural effusion, or pneumothorax. No acute osseous abnormality.  IMPRESSION: No active cardiopulmonary disease.   Electronically Signed   By: Obie DredgeWilliam T Derry M.D.   On: 06/14/2018 16:05 I personally reviewed the chest x-ray images and concur with the findings noted above  Impression: Mr. Mario Hill is a 67 year old gentleman who had bilateral VATS thymectomy about 3 months ago.  The anterior mediastinal lesion turned out to be a benign thymic cyst.  He does not need any follow-up for that.  He is not having any significant incisional pain and is not taking any narcotics.  Elevated right hemidiaphragm-consistent with phrenic nerve dysfunction.  Unchanged.  He is asymptomatic from that at present. He is exercising on a daily basis.  His exercise tolerance at this point is actually better than it was preoperatively.  I still think that will recover with time but it may take up to a year.  I will plan to repeat a chest x-ray in about 6 months.  If he becomes symptomatic from that we could consider a plication of the diaphragm.  Postoperative atrial fibrillation-he has been off amiodarone for 2 months without any issues.  Plan: Return in 6 months with chest x-ray  Loreli Slot, MD [facility] [phone]

## 2018-07-06 ENCOUNTER — Other Ambulatory Visit: Payer: Self-pay | Admitting: *Deleted

## 2018-07-06 NOTE — Patient Outreach (Addendum)
[facility] Care Management  07/06/2018  Mario JollyJohn S Maciver Sr. 05-16-1951 [id number]   Reached patient via contact number and completed a high risk assessment using patient's completed [employer] heath risk assessment form.  Mr. Mario Hill states he takes only one medication for his blood pressure (lisinopril)  and he has recently lost weight (over 20 lbs) and walks a mile daily so he will speak to his primary  care provider at his annual wellness visit in the fall regarding the need to continue the blood pressure medicine. He says he does have a home blood pressure monitor and he monitors his blood pressure regularly and the readings are always within target.  He would like information on completing an advance directive for himself and his wife so will mail 2 advance directive packets and the Emm[employer] education module entitled "Advance Directives" to his home address in addition to the [employer] successful outreach letter with the 24 Hour Nurse Advice Line magnet and a [facility] Care Management brochure.   Bary RichardJanet S. Win Guajardo RN,CCM,CDE [facility] Care Management Coordinator Office Phone [phone] Office Fax [phone]

## 2018-08-10 DIAGNOSIS — R7301 Impaired fasting glucose: Secondary | ICD-10-CM | POA: Diagnosis not present

## 2018-08-10 DIAGNOSIS — Z125 Encounter for screening for malignant neoplasm of prostate: Secondary | ICD-10-CM | POA: Diagnosis not present

## 2018-08-10 DIAGNOSIS — E7849 Other hyperlipidemia: Secondary | ICD-10-CM | POA: Diagnosis not present

## 2018-08-10 DIAGNOSIS — E038 Other specified hypothyroidism: Secondary | ICD-10-CM | POA: Diagnosis not present

## 2018-08-10 DIAGNOSIS — I10 Essential (primary) hypertension: Secondary | ICD-10-CM | POA: Diagnosis not present

## 2018-08-18 DIAGNOSIS — Z6828 Body mass index (BMI) 28.0-28.9, adult: Secondary | ICD-10-CM | POA: Diagnosis not present

## 2018-08-18 DIAGNOSIS — K219 Gastro-esophageal reflux disease without esophagitis: Secondary | ICD-10-CM | POA: Diagnosis not present

## 2018-08-18 DIAGNOSIS — R7301 Impaired fasting glucose: Secondary | ICD-10-CM | POA: Diagnosis not present

## 2018-08-18 DIAGNOSIS — Z0000 Encounter for general adult medical examination without abnormal findings: Secondary | ICD-10-CM | POA: Diagnosis not present

## 2018-08-18 DIAGNOSIS — R740 Nonspecific elevation of levels of transaminase and lactic acid dehydrogenase [LDH]: Secondary | ICD-10-CM | POA: Diagnosis not present

## 2018-08-18 DIAGNOSIS — E7849 Other hyperlipidemia: Secondary | ICD-10-CM | POA: Diagnosis not present

## 2018-08-18 DIAGNOSIS — Z1389 Encounter for screening for other disorder: Secondary | ICD-10-CM | POA: Diagnosis not present

## 2018-08-18 DIAGNOSIS — E038 Other specified hypothyroidism: Secondary | ICD-10-CM | POA: Diagnosis not present

## 2018-08-18 DIAGNOSIS — E328 Other diseases of thymus: Secondary | ICD-10-CM | POA: Diagnosis not present

## 2018-08-18 DIAGNOSIS — I77811 Abdominal aortic ectasia: Secondary | ICD-10-CM | POA: Diagnosis not present

## 2018-08-18 DIAGNOSIS — I10 Essential (primary) hypertension: Secondary | ICD-10-CM | POA: Diagnosis not present

## 2018-08-18 DIAGNOSIS — Z23 Encounter for immunization: Secondary | ICD-10-CM | POA: Diagnosis not present

## 2018-08-18 DIAGNOSIS — I2510 Atherosclerotic heart disease of native coronary artery without angina pectoris: Secondary | ICD-10-CM | POA: Diagnosis not present

## 2018-08-25 DIAGNOSIS — Z1212 Encounter for screening for malignant neoplasm of rectum: Secondary | ICD-10-CM | POA: Diagnosis not present

## 2018-10-29 DIAGNOSIS — M25561 Pain in right knee: Secondary | ICD-10-CM | POA: Diagnosis not present

## 2018-10-31 DIAGNOSIS — M25561 Pain in right knee: Secondary | ICD-10-CM | POA: Diagnosis not present

## 2018-11-10 DIAGNOSIS — M66251 Spontaneous rupture of extensor tendons, right thigh: Secondary | ICD-10-CM | POA: Diagnosis not present

## 2018-11-10 DIAGNOSIS — G8918 Other acute postprocedural pain: Secondary | ICD-10-CM | POA: Diagnosis not present

## 2018-11-12 ENCOUNTER — Other Ambulatory Visit (HOSPITAL_COMMUNITY): Payer: Self-pay | Admitting: Orthopedic Surgery

## 2018-11-12 MED ORDER — OXYCODONE HCL 5 MG PO TABS: 5.0000 mg | ORAL_TABLET | ORAL | 0 refills | Status: AC | PRN

## 2018-11-16 DIAGNOSIS — M66251 Spontaneous rupture of extensor tendons, right thigh: Secondary | ICD-10-CM | POA: Diagnosis not present

## 2018-11-17 IMAGING — CR DG CHEST 2V
2 series · 2 of 2 positions shown · non-contrast
Comparison: Radiographs March 19, 2018.

CLINICAL DATA: Mediastinal mass.

EXAM:
CHEST - 2 VIEW

[w chest pa]
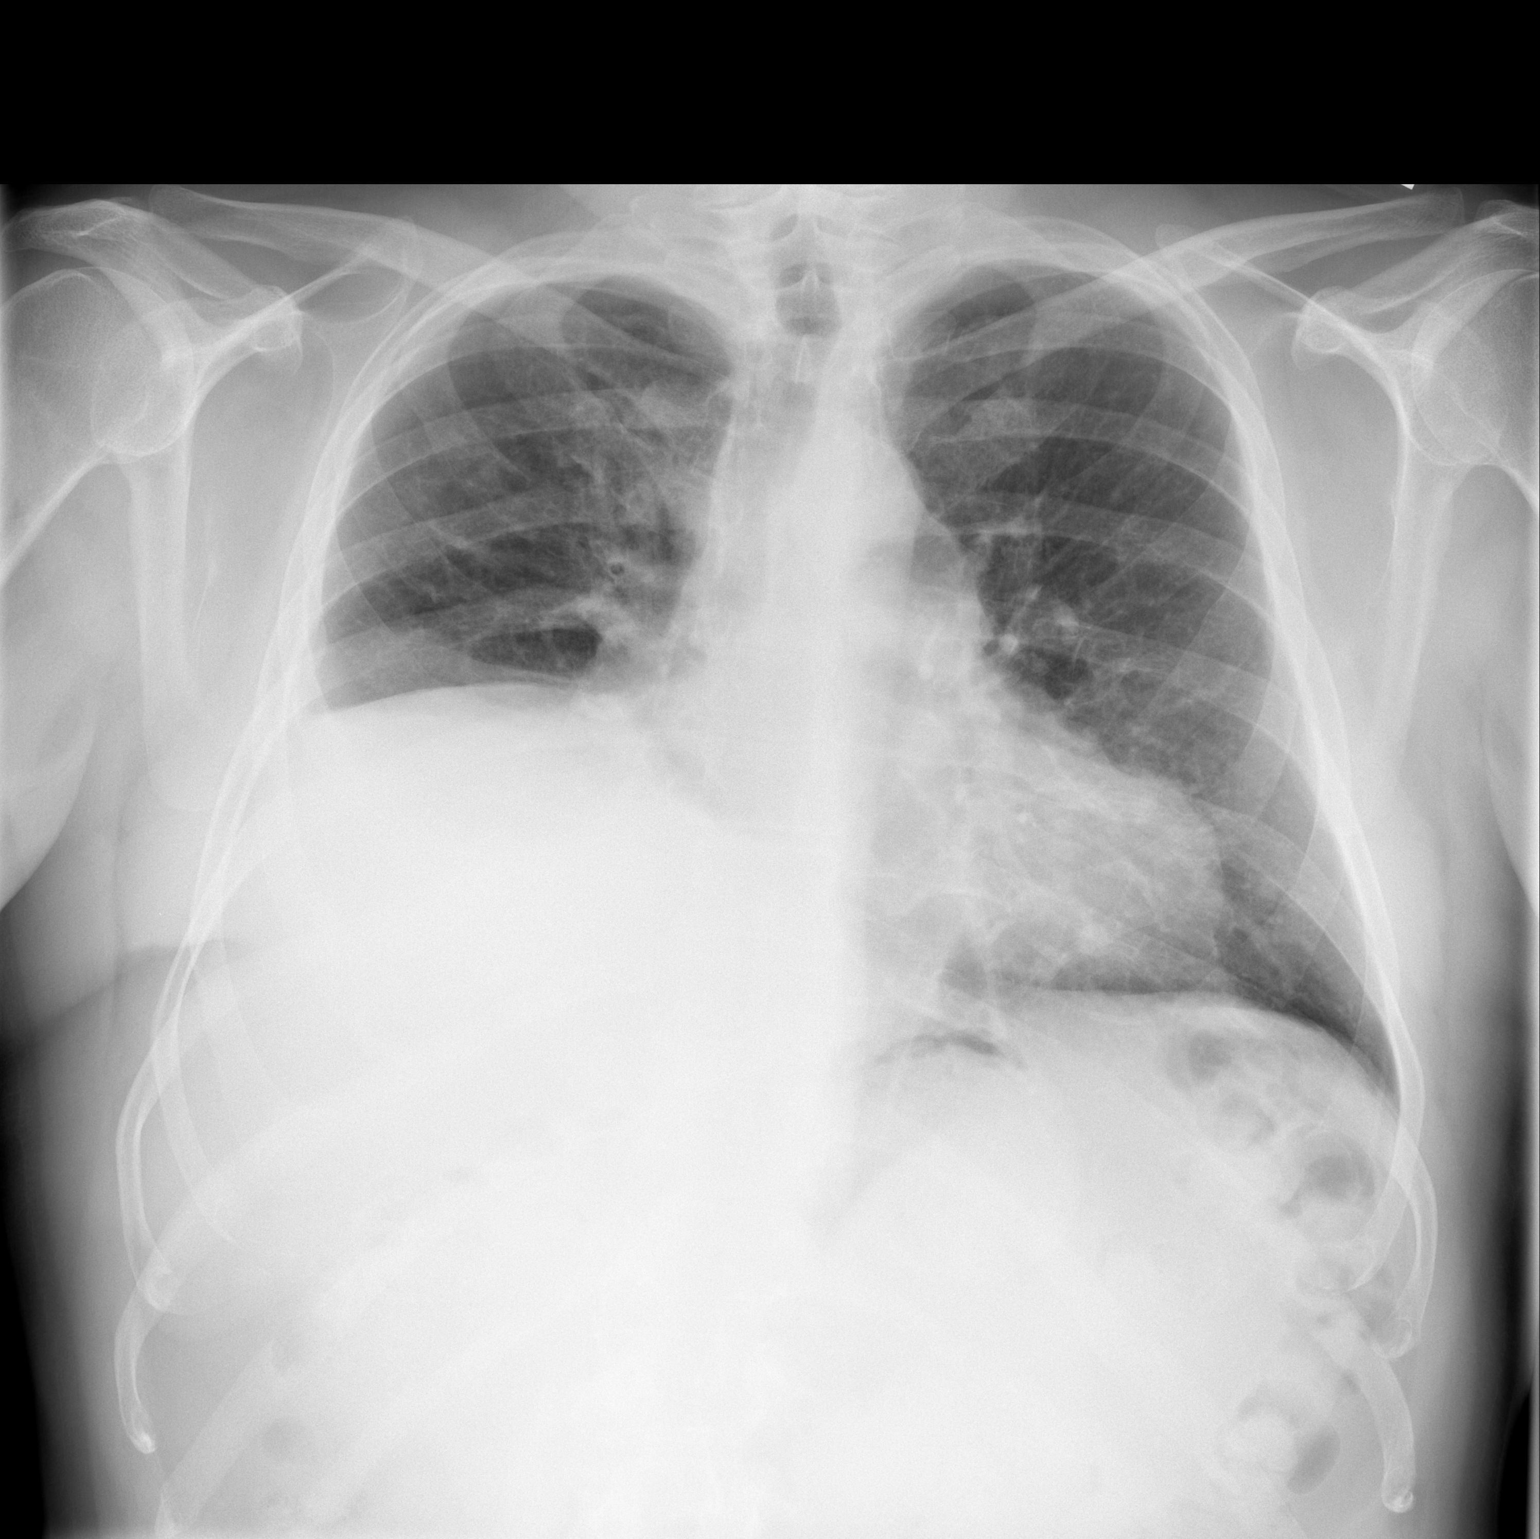

[w chest lat]
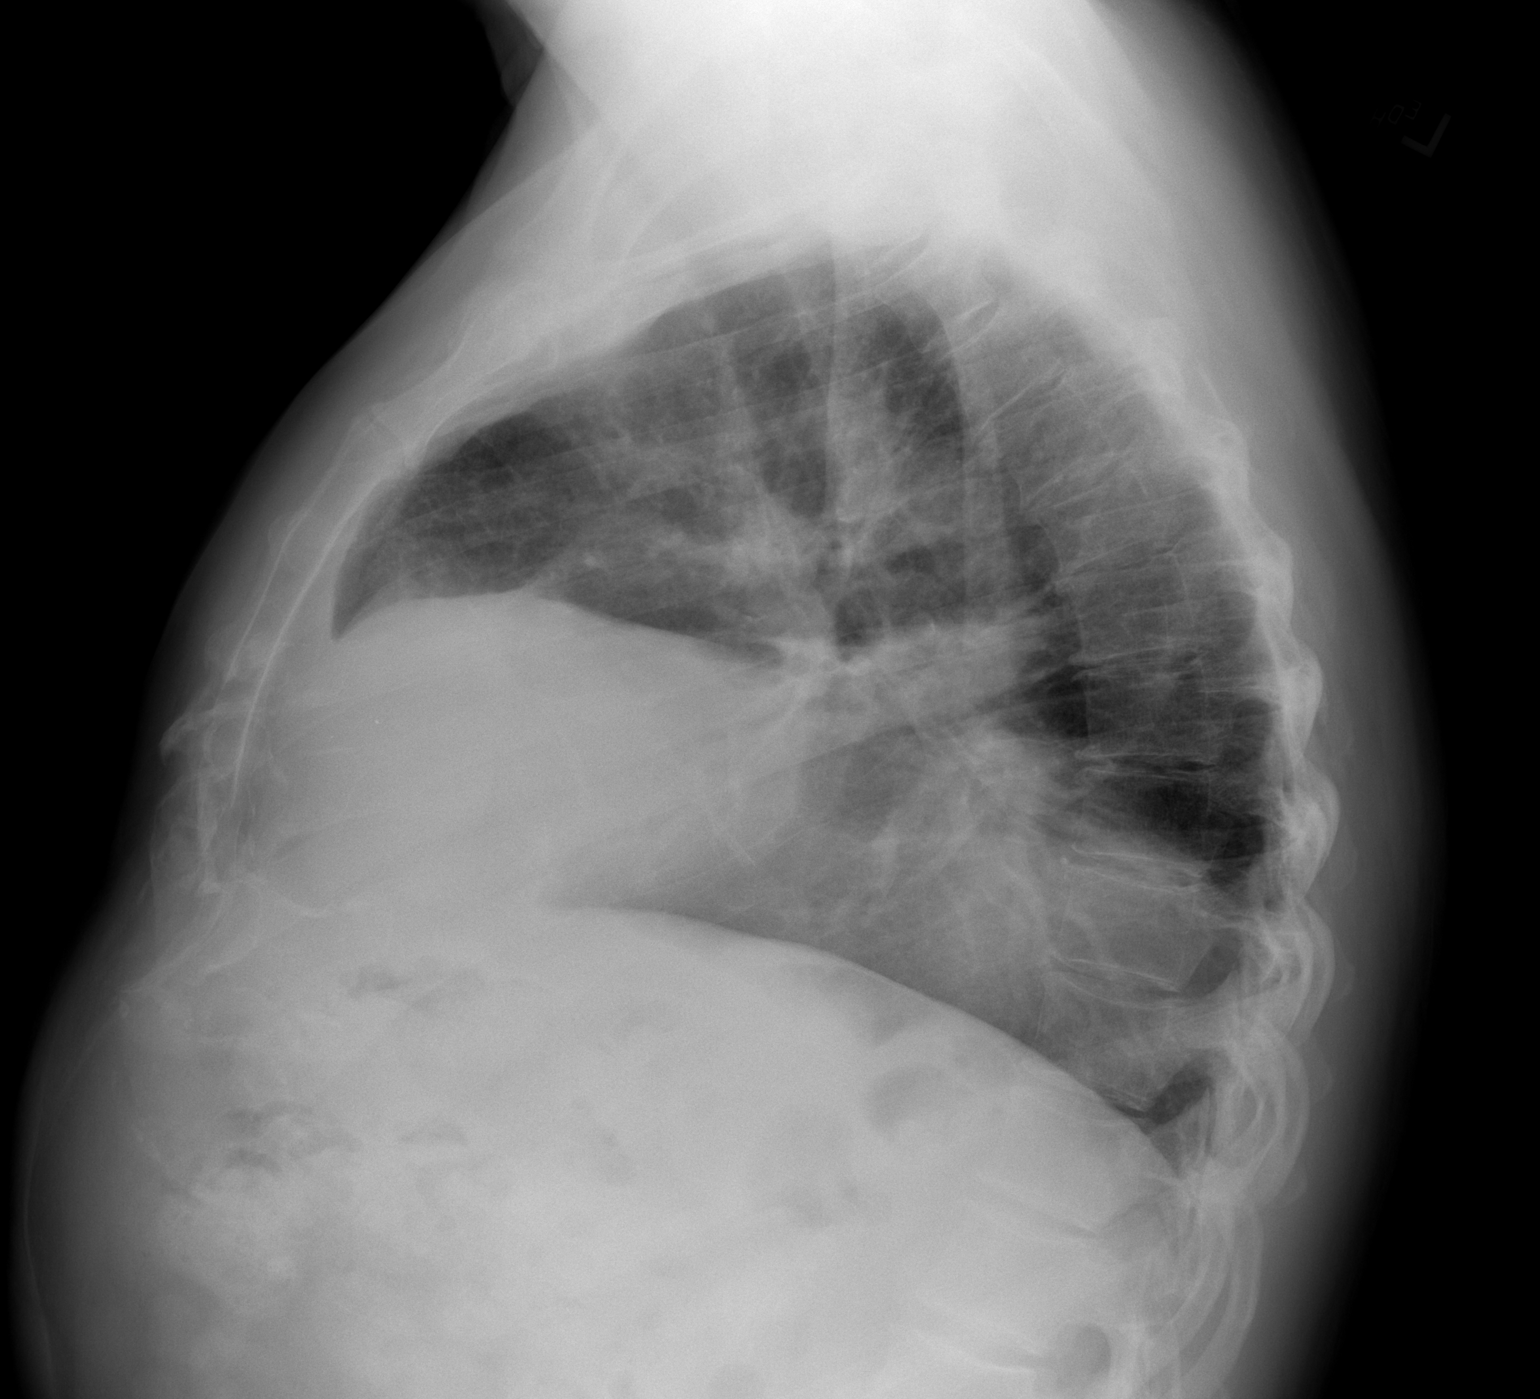

[2 of 2 positions shown; findings below may reference images not displayed]

FINDINGS: The heart size and mediastinal contours are within normal limits. No
pneumothorax is noted. Left lung is clear. Elevated right
hemidiaphragm is noted. Mild right basilar subsegmental atelectasis
with probable minimal pleural effusion is noted. The visualized
skeletal structures are unremarkable.
IMPRESSION: Elevated right hemidiaphragm with mild right basilar subsegmental
atelectasis and minimal right pleural effusion.

## 2018-12-09 DIAGNOSIS — M66251 Spontaneous rupture of extensor tendons, right thigh: Secondary | ICD-10-CM | POA: Diagnosis not present

## 2018-12-13 ENCOUNTER — Encounter: Payer: PPO | Admitting: Thoracic Surgery (Cardiothoracic Vascular Surgery)

## 2018-12-26 DIAGNOSIS — M25561 Pain in right knee: Secondary | ICD-10-CM | POA: Diagnosis not present

## 2018-12-26 DIAGNOSIS — M66251 Spontaneous rupture of extensor tendons, right thigh: Secondary | ICD-10-CM | POA: Diagnosis not present

## 2019-01-06 DIAGNOSIS — R262 Difficulty in walking, not elsewhere classified: Secondary | ICD-10-CM | POA: Diagnosis not present

## 2019-01-06 DIAGNOSIS — R29898 Other symptoms and signs involving the musculoskeletal system: Secondary | ICD-10-CM | POA: Diagnosis not present

## 2019-01-06 DIAGNOSIS — S76111A Strain of right quadriceps muscle, fascia and tendon, initial encounter: Secondary | ICD-10-CM | POA: Diagnosis not present

## 2019-01-06 DIAGNOSIS — M25661 Stiffness of right knee, not elsewhere classified: Secondary | ICD-10-CM | POA: Diagnosis not present

## 2019-01-23 DIAGNOSIS — M66251 Spontaneous rupture of extensor tendons, right thigh: Secondary | ICD-10-CM | POA: Diagnosis not present

## 2019-01-31 DIAGNOSIS — M25661 Stiffness of right knee, not elsewhere classified: Secondary | ICD-10-CM | POA: Diagnosis not present

## 2019-01-31 DIAGNOSIS — S76111A Strain of right quadriceps muscle, fascia and tendon, initial encounter: Secondary | ICD-10-CM | POA: Diagnosis not present

## 2019-01-31 DIAGNOSIS — R262 Difficulty in walking, not elsewhere classified: Secondary | ICD-10-CM | POA: Diagnosis not present

## 2019-01-31 DIAGNOSIS — R29898 Other symptoms and signs involving the musculoskeletal system: Secondary | ICD-10-CM | POA: Diagnosis not present

## 2019-02-20 DIAGNOSIS — M66251 Spontaneous rupture of extensor tendons, right thigh: Secondary | ICD-10-CM | POA: Diagnosis not present

## 2019-03-02 DIAGNOSIS — R531 Weakness: Secondary | ICD-10-CM | POA: Diagnosis not present

## 2019-03-02 DIAGNOSIS — S76111A Strain of right quadriceps muscle, fascia and tendon, initial encounter: Secondary | ICD-10-CM | POA: Diagnosis not present

## 2019-03-02 DIAGNOSIS — R262 Difficulty in walking, not elsewhere classified: Secondary | ICD-10-CM | POA: Diagnosis not present

## 2019-03-02 DIAGNOSIS — M25661 Stiffness of right knee, not elsewhere classified: Secondary | ICD-10-CM | POA: Diagnosis not present

## 2019-04-05 ENCOUNTER — Other Ambulatory Visit: Payer: Self-pay | Admitting: Internal Medicine

## 2019-04-05 DIAGNOSIS — F17201 Nicotine dependence, unspecified, in remission: Secondary | ICD-10-CM

## 2019-04-14 ENCOUNTER — Ambulatory Visit
Admission: RE | Admit: 2019-04-14 | Discharge: 2019-04-14 | Disposition: A | Discharge status: Home / Self Care | Payer: PPO | Source: Ambulatory Visit | Attending: Internal Medicine | Admitting: Internal Medicine

## 2019-04-14 DIAGNOSIS — F17201 Nicotine dependence, unspecified, in remission: Secondary | ICD-10-CM

## 2019-04-14 DIAGNOSIS — Z87891 Personal history of nicotine dependence: Secondary | ICD-10-CM | POA: Diagnosis not present

## 2019-04-14 DIAGNOSIS — Z136 Encounter for screening for cardiovascular disorders: Secondary | ICD-10-CM | POA: Diagnosis not present

## 2019-06-26 ENCOUNTER — Other Ambulatory Visit: Payer: Self-pay

## 2019-06-26 DIAGNOSIS — Z20822 Contact with and (suspected) exposure to covid-19: Secondary | ICD-10-CM

## 2019-06-27 LAB — NOVEL CORONAVIRUS, NAA: SARS-CoV-2, NAA: NOT DETECTED

## 2019-07-03 ENCOUNTER — Encounter: Payer: Self-pay | Admitting: Internal Medicine

## 2019-08-23 DIAGNOSIS — R7301 Impaired fasting glucose: Secondary | ICD-10-CM | POA: Diagnosis not present

## 2019-08-23 DIAGNOSIS — Z125 Encounter for screening for malignant neoplasm of prostate: Secondary | ICD-10-CM | POA: Diagnosis not present

## 2019-08-23 DIAGNOSIS — E7849 Other hyperlipidemia: Secondary | ICD-10-CM | POA: Diagnosis not present

## 2019-08-23 DIAGNOSIS — E038 Other specified hypothyroidism: Secondary | ICD-10-CM | POA: Diagnosis not present

## 2019-08-30 DIAGNOSIS — I2510 Atherosclerotic heart disease of native coronary artery without angina pectoris: Secondary | ICD-10-CM | POA: Diagnosis not present

## 2019-08-30 DIAGNOSIS — Z0000 Encounter for general adult medical examination without abnormal findings: Secondary | ICD-10-CM | POA: Diagnosis not present

## 2019-08-30 DIAGNOSIS — E039 Hypothyroidism, unspecified: Secondary | ICD-10-CM | POA: Diagnosis not present

## 2019-08-30 DIAGNOSIS — R918 Other nonspecific abnormal finding of lung field: Secondary | ICD-10-CM | POA: Diagnosis not present

## 2019-08-30 DIAGNOSIS — R222 Localized swelling, mass and lump, trunk: Secondary | ICD-10-CM | POA: Diagnosis not present

## 2019-08-30 DIAGNOSIS — I10 Essential (primary) hypertension: Secondary | ICD-10-CM | POA: Diagnosis not present

## 2019-08-30 DIAGNOSIS — Z9089 Acquired absence of other organs: Secondary | ICD-10-CM | POA: Diagnosis not present

## 2019-08-30 DIAGNOSIS — Z1339 Encounter for screening examination for other mental health and behavioral disorders: Secondary | ICD-10-CM | POA: Diagnosis not present

## 2019-08-30 DIAGNOSIS — Z1331 Encounter for screening for depression: Secondary | ICD-10-CM | POA: Diagnosis not present

## 2019-08-30 DIAGNOSIS — E785 Hyperlipidemia, unspecified: Secondary | ICD-10-CM | POA: Diagnosis not present

## 2019-08-30 DIAGNOSIS — I700 Atherosclerosis of aorta: Secondary | ICD-10-CM | POA: Diagnosis not present

## 2019-08-30 DIAGNOSIS — I77811 Abdominal aortic ectasia: Secondary | ICD-10-CM | POA: Diagnosis not present

## 2019-08-30 DIAGNOSIS — Z23 Encounter for immunization: Secondary | ICD-10-CM | POA: Diagnosis not present

## 2019-08-30 DIAGNOSIS — J439 Emphysema, unspecified: Secondary | ICD-10-CM | POA: Diagnosis not present

## 2019-08-30 DIAGNOSIS — F325 Major depressive disorder, single episode, in full remission: Secondary | ICD-10-CM | POA: Diagnosis not present

## 2019-08-30 DIAGNOSIS — R82998 Other abnormal findings in urine: Secondary | ICD-10-CM | POA: Diagnosis not present

## 2019-08-30 DIAGNOSIS — R7301 Impaired fasting glucose: Secondary | ICD-10-CM | POA: Diagnosis not present

## 2019-09-08 DIAGNOSIS — Z1212 Encounter for screening for malignant neoplasm of rectum: Secondary | ICD-10-CM | POA: Diagnosis not present

## 2019-09-19 ENCOUNTER — Other Ambulatory Visit: Payer: Self-pay | Admitting: Internal Medicine

## 2019-09-19 DIAGNOSIS — R9389 Abnormal findings on diagnostic imaging of other specified body structures: Secondary | ICD-10-CM

## 2019-09-20 ENCOUNTER — Other Ambulatory Visit: Payer: Self-pay | Admitting: Internal Medicine

## 2019-09-20 DIAGNOSIS — R9389 Abnormal findings on diagnostic imaging of other specified body structures: Secondary | ICD-10-CM

## 2019-09-22 ENCOUNTER — Ambulatory Visit
Admission: RE | Admit: 2019-09-22 | Discharge: 2019-09-22 | Disposition: A | Discharge status: Home / Self Care | Payer: PPO | Source: Ambulatory Visit | Attending: Internal Medicine | Admitting: Internal Medicine

## 2019-09-22 DIAGNOSIS — R9389 Abnormal findings on diagnostic imaging of other specified body structures: Secondary | ICD-10-CM

## 2019-09-22 DIAGNOSIS — J439 Emphysema, unspecified: Secondary | ICD-10-CM | POA: Diagnosis not present

## 2019-09-22 DIAGNOSIS — J181 Lobar pneumonia, unspecified organism: Secondary | ICD-10-CM | POA: Diagnosis not present

## 2019-09-22 MED ORDER — IOPAMIDOL (ISOVUE-300) INJECTION 61%
75.0000 mL | Freq: Once | INTRAVENOUS | Status: AC | PRN
  Administered 2019-09-22: 75 mL via INTRAVENOUS

## 2019-10-06 ENCOUNTER — Encounter: Payer: Self-pay | Admitting: Internal Medicine

## 2020-08-30 DIAGNOSIS — Z125 Encounter for screening for malignant neoplasm of prostate: Secondary | ICD-10-CM | POA: Diagnosis not present

## 2020-08-30 DIAGNOSIS — E039 Hypothyroidism, unspecified: Secondary | ICD-10-CM | POA: Diagnosis not present

## 2020-08-30 DIAGNOSIS — E785 Hyperlipidemia, unspecified: Secondary | ICD-10-CM | POA: Diagnosis not present

## 2020-10-01 DIAGNOSIS — F325 Major depressive disorder, single episode, in full remission: Secondary | ICD-10-CM | POA: Diagnosis not present

## 2020-10-01 DIAGNOSIS — I700 Atherosclerosis of aorta: Secondary | ICD-10-CM | POA: Diagnosis not present

## 2020-10-01 DIAGNOSIS — E039 Hypothyroidism, unspecified: Secondary | ICD-10-CM | POA: Diagnosis not present

## 2020-10-01 DIAGNOSIS — R29890 Loss of height: Secondary | ICD-10-CM | POA: Diagnosis not present

## 2020-10-01 DIAGNOSIS — I77811 Abdominal aortic ectasia: Secondary | ICD-10-CM | POA: Diagnosis not present

## 2020-10-01 DIAGNOSIS — R82998 Other abnormal findings in urine: Secondary | ICD-10-CM | POA: Diagnosis not present

## 2020-10-01 DIAGNOSIS — I2510 Atherosclerotic heart disease of native coronary artery without angina pectoris: Secondary | ICD-10-CM | POA: Diagnosis not present

## 2020-10-01 DIAGNOSIS — I10 Essential (primary) hypertension: Secondary | ICD-10-CM | POA: Diagnosis not present

## 2020-10-01 DIAGNOSIS — Z1339 Encounter for screening examination for other mental health and behavioral disorders: Secondary | ICD-10-CM | POA: Diagnosis not present

## 2020-10-01 DIAGNOSIS — R7301 Impaired fasting glucose: Secondary | ICD-10-CM | POA: Diagnosis not present

## 2020-10-01 DIAGNOSIS — Z23 Encounter for immunization: Secondary | ICD-10-CM | POA: Diagnosis not present

## 2020-10-01 DIAGNOSIS — F17201 Nicotine dependence, unspecified, in remission: Secondary | ICD-10-CM | POA: Diagnosis not present

## 2020-10-01 DIAGNOSIS — E785 Hyperlipidemia, unspecified: Secondary | ICD-10-CM | POA: Diagnosis not present

## 2020-10-01 DIAGNOSIS — Z0000 Encounter for general adult medical examination without abnormal findings: Secondary | ICD-10-CM | POA: Diagnosis not present

## 2020-10-01 DIAGNOSIS — Z1331 Encounter for screening for depression: Secondary | ICD-10-CM | POA: Diagnosis not present

## 2020-10-01 DIAGNOSIS — J439 Emphysema, unspecified: Secondary | ICD-10-CM | POA: Diagnosis not present

## 2020-10-09 ENCOUNTER — Other Ambulatory Visit: Payer: Self-pay | Admitting: Internal Medicine

## 2020-10-09 DIAGNOSIS — F17201 Nicotine dependence, unspecified, in remission: Secondary | ICD-10-CM

## 2020-10-15 DIAGNOSIS — M4004 Postural kyphosis, thoracic region: Secondary | ICD-10-CM | POA: Diagnosis not present

## 2020-10-15 DIAGNOSIS — Y93B1 Activity, exercise machines primarily for muscle strengthening: Secondary | ICD-10-CM | POA: Diagnosis not present

## 2020-10-28 DIAGNOSIS — Z1212 Encounter for screening for malignant neoplasm of rectum: Secondary | ICD-10-CM | POA: Diagnosis not present

## 2020-10-29 ENCOUNTER — Ambulatory Visit
Admission: RE | Admit: 2020-10-29 | Discharge: 2020-10-29 | Disposition: A | Discharge status: Home / Self Care | Payer: PPO | Source: Ambulatory Visit | Attending: Internal Medicine | Admitting: Internal Medicine

## 2020-10-29 ENCOUNTER — Other Ambulatory Visit: Payer: Self-pay

## 2020-10-29 DIAGNOSIS — Z87891 Personal history of nicotine dependence: Secondary | ICD-10-CM | POA: Diagnosis not present

## 2020-10-29 DIAGNOSIS — I2510 Atherosclerotic heart disease of native coronary artery without angina pectoris: Secondary | ICD-10-CM | POA: Diagnosis not present

## 2020-10-29 DIAGNOSIS — J439 Emphysema, unspecified: Secondary | ICD-10-CM | POA: Diagnosis not present

## 2020-10-29 DIAGNOSIS — F17201 Nicotine dependence, unspecified, in remission: Secondary | ICD-10-CM

## 2020-10-29 DIAGNOSIS — I700 Atherosclerosis of aorta: Secondary | ICD-10-CM | POA: Diagnosis not present

## 2020-10-31 DIAGNOSIS — M4004 Postural kyphosis, thoracic region: Secondary | ICD-10-CM | POA: Diagnosis not present

## 2020-10-31 DIAGNOSIS — Y93B1 Activity, exercise machines primarily for muscle strengthening: Secondary | ICD-10-CM | POA: Diagnosis not present

## 2020-11-04 DIAGNOSIS — Y93B1 Activity, exercise machines primarily for muscle strengthening: Secondary | ICD-10-CM | POA: Diagnosis not present

## 2020-11-04 DIAGNOSIS — M4004 Postural kyphosis, thoracic region: Secondary | ICD-10-CM | POA: Diagnosis not present

## 2020-11-08 DIAGNOSIS — Y93B1 Activity, exercise machines primarily for muscle strengthening: Secondary | ICD-10-CM | POA: Diagnosis not present

## 2020-11-08 DIAGNOSIS — M4004 Postural kyphosis, thoracic region: Secondary | ICD-10-CM | POA: Diagnosis not present

## 2020-11-12 DIAGNOSIS — M4004 Postural kyphosis, thoracic region: Secondary | ICD-10-CM | POA: Diagnosis not present

## 2020-11-12 DIAGNOSIS — Y93B1 Activity, exercise machines primarily for muscle strengthening: Secondary | ICD-10-CM | POA: Diagnosis not present

## 2020-11-14 DIAGNOSIS — Y93B1 Activity, exercise machines primarily for muscle strengthening: Secondary | ICD-10-CM | POA: Diagnosis not present

## 2020-11-14 DIAGNOSIS — M4004 Postural kyphosis, thoracic region: Secondary | ICD-10-CM | POA: Diagnosis not present

## 2020-11-24 DIAGNOSIS — Z20822 Contact with and (suspected) exposure to covid-19: Secondary | ICD-10-CM | POA: Diagnosis not present

## 2020-11-26 DIAGNOSIS — Y93B1 Activity, exercise machines primarily for muscle strengthening: Secondary | ICD-10-CM | POA: Diagnosis not present

## 2020-11-26 DIAGNOSIS — M4004 Postural kyphosis, thoracic region: Secondary | ICD-10-CM | POA: Diagnosis not present

## 2020-11-28 DIAGNOSIS — Y93B1 Activity, exercise machines primarily for muscle strengthening: Secondary | ICD-10-CM | POA: Diagnosis not present

## 2020-11-28 DIAGNOSIS — M4004 Postural kyphosis, thoracic region: Secondary | ICD-10-CM | POA: Diagnosis not present

## 2021-10-20 DIAGNOSIS — E785 Hyperlipidemia, unspecified: Secondary | ICD-10-CM | POA: Diagnosis not present

## 2021-10-20 DIAGNOSIS — S8001XA Contusion of right knee, initial encounter: Secondary | ICD-10-CM | POA: Diagnosis not present

## 2021-10-20 DIAGNOSIS — Z125 Encounter for screening for malignant neoplasm of prostate: Secondary | ICD-10-CM | POA: Diagnosis not present

## 2021-10-20 DIAGNOSIS — R7301 Impaired fasting glucose: Secondary | ICD-10-CM | POA: Diagnosis not present

## 2021-10-20 DIAGNOSIS — I10 Essential (primary) hypertension: Secondary | ICD-10-CM | POA: Diagnosis not present

## 2021-10-20 DIAGNOSIS — E039 Hypothyroidism, unspecified: Secondary | ICD-10-CM | POA: Diagnosis not present

## 2021-10-27 DIAGNOSIS — I77811 Abdominal aortic ectasia: Secondary | ICD-10-CM | POA: Diagnosis not present

## 2021-10-27 DIAGNOSIS — J439 Emphysema, unspecified: Secondary | ICD-10-CM | POA: Diagnosis not present

## 2021-10-27 DIAGNOSIS — Z23 Encounter for immunization: Secondary | ICD-10-CM | POA: Diagnosis not present

## 2021-10-27 DIAGNOSIS — E039 Hypothyroidism, unspecified: Secondary | ICD-10-CM | POA: Diagnosis not present

## 2021-10-27 DIAGNOSIS — Z0000 Encounter for general adult medical examination without abnormal findings: Secondary | ICD-10-CM | POA: Diagnosis not present

## 2021-10-27 DIAGNOSIS — I700 Atherosclerosis of aorta: Secondary | ICD-10-CM | POA: Diagnosis not present

## 2021-10-27 DIAGNOSIS — Z1331 Encounter for screening for depression: Secondary | ICD-10-CM | POA: Diagnosis not present

## 2021-10-27 DIAGNOSIS — N1831 Chronic kidney disease, stage 3a: Secondary | ICD-10-CM | POA: Diagnosis not present

## 2021-10-27 DIAGNOSIS — I2510 Atherosclerotic heart disease of native coronary artery without angina pectoris: Secondary | ICD-10-CM | POA: Diagnosis not present

## 2021-10-27 DIAGNOSIS — E785 Hyperlipidemia, unspecified: Secondary | ICD-10-CM | POA: Diagnosis not present

## 2021-10-27 DIAGNOSIS — R82998 Other abnormal findings in urine: Secondary | ICD-10-CM | POA: Diagnosis not present

## 2021-10-27 DIAGNOSIS — R7301 Impaired fasting glucose: Secondary | ICD-10-CM | POA: Diagnosis not present

## 2021-10-27 DIAGNOSIS — R29890 Loss of height: Secondary | ICD-10-CM | POA: Diagnosis not present

## 2021-10-27 DIAGNOSIS — Z1339 Encounter for screening examination for other mental health and behavioral disorders: Secondary | ICD-10-CM | POA: Diagnosis not present

## 2021-10-27 DIAGNOSIS — F17201 Nicotine dependence, unspecified, in remission: Secondary | ICD-10-CM | POA: Diagnosis not present

## 2021-10-27 DIAGNOSIS — I10 Essential (primary) hypertension: Secondary | ICD-10-CM | POA: Diagnosis not present

## 2021-10-28 ENCOUNTER — Other Ambulatory Visit: Payer: Self-pay | Admitting: Internal Medicine

## 2021-10-28 DIAGNOSIS — F17201 Nicotine dependence, unspecified, in remission: Secondary | ICD-10-CM

## 2021-11-19 ENCOUNTER — Ambulatory Visit
Admission: RE | Admit: 2021-11-19 | Discharge: 2021-11-19 | Disposition: A | Discharge status: Home / Self Care | Payer: PPO | Source: Ambulatory Visit | Attending: Internal Medicine | Admitting: Internal Medicine

## 2021-11-19 DIAGNOSIS — J432 Centrilobular emphysema: Secondary | ICD-10-CM | POA: Diagnosis not present

## 2021-11-19 DIAGNOSIS — I2510 Atherosclerotic heart disease of native coronary artery without angina pectoris: Secondary | ICD-10-CM | POA: Diagnosis not present

## 2021-11-19 DIAGNOSIS — Z87891 Personal history of nicotine dependence: Secondary | ICD-10-CM | POA: Diagnosis not present

## 2021-11-19 DIAGNOSIS — I700 Atherosclerosis of aorta: Secondary | ICD-10-CM | POA: Diagnosis not present

## 2021-11-19 DIAGNOSIS — F17201 Nicotine dependence, unspecified, in remission: Secondary | ICD-10-CM

## 2021-12-01 DIAGNOSIS — Z1212 Encounter for screening for malignant neoplasm of rectum: Secondary | ICD-10-CM | POA: Diagnosis not present

## 2022-10-26 DIAGNOSIS — I10 Essential (primary) hypertension: Secondary | ICD-10-CM | POA: Diagnosis not present

## 2022-10-26 DIAGNOSIS — R7301 Impaired fasting glucose: Secondary | ICD-10-CM | POA: Diagnosis not present

## 2022-10-26 DIAGNOSIS — E785 Hyperlipidemia, unspecified: Secondary | ICD-10-CM | POA: Diagnosis not present

## 2022-10-26 DIAGNOSIS — E039 Hypothyroidism, unspecified: Secondary | ICD-10-CM | POA: Diagnosis not present

## 2022-10-26 DIAGNOSIS — Z125 Encounter for screening for malignant neoplasm of prostate: Secondary | ICD-10-CM | POA: Diagnosis not present

## 2022-10-29 DIAGNOSIS — R7301 Impaired fasting glucose: Secondary | ICD-10-CM | POA: Diagnosis not present

## 2022-10-29 DIAGNOSIS — F17201 Nicotine dependence, unspecified, in remission: Secondary | ICD-10-CM | POA: Diagnosis not present

## 2022-10-29 DIAGNOSIS — I2510 Atherosclerotic heart disease of native coronary artery without angina pectoris: Secondary | ICD-10-CM | POA: Diagnosis not present

## 2022-10-29 DIAGNOSIS — Z0000 Encounter for general adult medical examination without abnormal findings: Secondary | ICD-10-CM | POA: Diagnosis not present

## 2022-10-29 DIAGNOSIS — I700 Atherosclerosis of aorta: Secondary | ICD-10-CM | POA: Diagnosis not present

## 2022-10-29 DIAGNOSIS — Z1339 Encounter for screening examination for other mental health and behavioral disorders: Secondary | ICD-10-CM | POA: Diagnosis not present

## 2022-10-29 DIAGNOSIS — Z86018 Personal history of other benign neoplasm: Secondary | ICD-10-CM | POA: Diagnosis not present

## 2022-10-29 DIAGNOSIS — N1831 Chronic kidney disease, stage 3a: Secondary | ICD-10-CM | POA: Diagnosis not present

## 2022-10-29 DIAGNOSIS — Z1331 Encounter for screening for depression: Secondary | ICD-10-CM | POA: Diagnosis not present

## 2022-10-29 DIAGNOSIS — I77811 Abdominal aortic ectasia: Secondary | ICD-10-CM | POA: Diagnosis not present

## 2022-10-29 DIAGNOSIS — Z23 Encounter for immunization: Secondary | ICD-10-CM | POA: Diagnosis not present

## 2022-10-29 DIAGNOSIS — I10 Essential (primary) hypertension: Secondary | ICD-10-CM | POA: Diagnosis not present

## 2022-10-29 DIAGNOSIS — R82998 Other abnormal findings in urine: Secondary | ICD-10-CM | POA: Diagnosis not present

## 2022-10-30 ENCOUNTER — Other Ambulatory Visit: Payer: Self-pay | Admitting: Internal Medicine

## 2022-10-30 DIAGNOSIS — I77811 Abdominal aortic ectasia: Secondary | ICD-10-CM

## 2022-10-30 DIAGNOSIS — F17201 Nicotine dependence, unspecified, in remission: Secondary | ICD-10-CM

## 2022-11-12 ENCOUNTER — Encounter: Payer: Self-pay | Admitting: Internal Medicine

## 2022-11-12 ENCOUNTER — Ambulatory Visit (AMBULATORY_SURGERY_CENTER): Payer: Self-pay

## 2022-11-12 VITALS — Ht 70.0 in | Wt 190.0 lb

## 2022-11-12 DIAGNOSIS — Z1211 Encounter for screening for malignant neoplasm of colon: Secondary | ICD-10-CM

## 2022-11-12 MED ORDER — PEG 3350-KCL-NA BICARB-NACL 420 G PO SOLR: 4000.0000 mL | Freq: Once | ORAL | 0 refills | Status: AC

## 2022-11-12 NOTE — Progress Notes (Signed)

## 2022-11-18 ENCOUNTER — Other Ambulatory Visit: Payer: PPO

## 2022-12-03 ENCOUNTER — Encounter: Payer: PPO | Admitting: Internal Medicine

## 2022-12-03 ENCOUNTER — Telehealth: Payer: Self-pay | Admitting: Internal Medicine

## 2022-12-03 NOTE — Telephone Encounter (Signed)
Good Morning Dr. Henrene Pastor,  I called this patient at 7:15 am and left a message for him to call us if he was running late or needed to reschedule.    I Will NO SHOW him.  [facility]

## 2022-12-16 ENCOUNTER — Ambulatory Visit
Admission: RE | Admit: 2022-12-16 | Discharge: 2022-12-16 | Disposition: A | Discharge status: Home / Self Care | Payer: PPO | Source: Ambulatory Visit | Attending: Internal Medicine | Admitting: Internal Medicine

## 2022-12-16 DIAGNOSIS — Z87891 Personal history of nicotine dependence: Secondary | ICD-10-CM | POA: Diagnosis not present

## 2022-12-16 DIAGNOSIS — I77811 Abdominal aortic ectasia: Secondary | ICD-10-CM

## 2022-12-16 DIAGNOSIS — K449 Diaphragmatic hernia without obstruction or gangrene: Secondary | ICD-10-CM | POA: Diagnosis not present

## 2022-12-16 DIAGNOSIS — I77819 Aortic ectasia, unspecified site: Secondary | ICD-10-CM | POA: Diagnosis not present

## 2022-12-16 DIAGNOSIS — I2510 Atherosclerotic heart disease of native coronary artery without angina pectoris: Secondary | ICD-10-CM | POA: Diagnosis not present

## 2022-12-16 DIAGNOSIS — F17201 Nicotine dependence, unspecified, in remission: Secondary | ICD-10-CM

## 2022-12-16 DIAGNOSIS — J432 Centrilobular emphysema: Secondary | ICD-10-CM | POA: Diagnosis not present

## 2023-02-16 DIAGNOSIS — H2513 Age-related nuclear cataract, bilateral: Secondary | ICD-10-CM | POA: Diagnosis not present

## 2023-02-16 DIAGNOSIS — H35372 Puckering of macula, left eye: Secondary | ICD-10-CM | POA: Diagnosis not present

## 2023-02-16 DIAGNOSIS — H43813 Vitreous degeneration, bilateral: Secondary | ICD-10-CM | POA: Diagnosis not present

## 2023-10-25 DIAGNOSIS — B029 Zoster without complications: Secondary | ICD-10-CM | POA: Diagnosis not present

## 2023-11-01 DIAGNOSIS — E785 Hyperlipidemia, unspecified: Secondary | ICD-10-CM | POA: Diagnosis not present

## 2023-11-01 DIAGNOSIS — Z125 Encounter for screening for malignant neoplasm of prostate: Secondary | ICD-10-CM | POA: Diagnosis not present

## 2023-11-01 DIAGNOSIS — R7301 Impaired fasting glucose: Secondary | ICD-10-CM | POA: Diagnosis not present

## 2023-11-01 DIAGNOSIS — E039 Hypothyroidism, unspecified: Secondary | ICD-10-CM | POA: Diagnosis not present

## 2023-11-04 DIAGNOSIS — I10 Essential (primary) hypertension: Secondary | ICD-10-CM | POA: Diagnosis not present

## 2023-11-04 DIAGNOSIS — Z1339 Encounter for screening examination for other mental health and behavioral disorders: Secondary | ICD-10-CM | POA: Diagnosis not present

## 2023-11-04 DIAGNOSIS — I129 Hypertensive chronic kidney disease with stage 1 through stage 4 chronic kidney disease, or unspecified chronic kidney disease: Secondary | ICD-10-CM | POA: Diagnosis not present

## 2023-11-04 DIAGNOSIS — Z1331 Encounter for screening for depression: Secondary | ICD-10-CM | POA: Diagnosis not present

## 2023-11-04 DIAGNOSIS — Z0000 Encounter for general adult medical examination without abnormal findings: Secondary | ICD-10-CM | POA: Diagnosis not present

## 2023-11-04 DIAGNOSIS — I700 Atherosclerosis of aorta: Secondary | ICD-10-CM | POA: Diagnosis not present

## 2023-11-29 DIAGNOSIS — H524 Presbyopia: Secondary | ICD-10-CM | POA: Diagnosis not present

## 2024-03-16 ENCOUNTER — Other Ambulatory Visit: Payer: Self-pay

## 2024-03-16 ENCOUNTER — Emergency Department (HOSPITAL_COMMUNITY)

## 2024-03-16 ENCOUNTER — Encounter (HOSPITAL_COMMUNITY): Payer: Self-pay

## 2024-03-16 ENCOUNTER — Emergency Department (HOSPITAL_COMMUNITY)
Admission: EM | Admit: 2024-03-16 | Discharge: 2024-03-16 | Disposition: A | Discharge status: Home / Self Care | Attending: Emergency Medicine | Admitting: Emergency Medicine

## 2024-03-16 DIAGNOSIS — M25562 Pain in left knee: Secondary | ICD-10-CM

## 2024-03-16 DIAGNOSIS — W19XXXA Unspecified fall, initial encounter: Secondary | ICD-10-CM | POA: Diagnosis not present

## 2024-03-16 DIAGNOSIS — Z043 Encounter for examination and observation following other accident: Secondary | ICD-10-CM | POA: Diagnosis not present

## 2024-03-16 LAB — COMPREHENSIVE METABOLIC PANEL WITH GFR
ALT: 24 U/L (ref 0–44)
AST: 24 U/L (ref 15–41)
Albumin: 4.1 g/dL (ref 3.5–5.0)
Alkaline Phosphatase: 57 U/L (ref 38–126)
Anion gap: 10 (ref 5–15)
BUN: 16 mg/dL (ref 8–23)
CO2: 24 mmol/L (ref 22–32)
Calcium: 9.3 mg/dL (ref 8.9–10.3)
Chloride: 104 mmol/L (ref 98–111)
Creatinine, Ser: 1.23 mg/dL (ref 0.61–1.24)
GFR, Estimated: >60 mL/min (ref >60)
Glucose, Bld: 110 mg/dL — ABNORMAL HIGH (ref 70–99)
Potassium: 4 mmol/L (ref 3.5–5.1)
Sodium: 138 mmol/L (ref 135–145)
Total Bilirubin: 0.9 mg/dL (ref 0.0–1.2)
Total Protein: 6.9 g/dL (ref 6.5–8.1)

## 2024-03-16 LAB — CBC WITH DIFFERENTIAL/PLATELET
Abs Immature Granulocytes: 0.02 10*3/uL (ref 0.00–0.07)
Basophils Absolute: 0 10*3/uL (ref 0.0–0.1)
Basophils Relative: 0 %
Eosinophils Absolute: 0.1 10*3/uL (ref 0.0–0.5)
Eosinophils Relative: 0 %
HCT: 43.3 % (ref 39.0–52.0)
Hemoglobin: 15.2 g/dL (ref 13.0–17.0)
Immature Granulocytes: 0 %
Lymphocytes Relative: 11 %
Lymphs Abs: 1.2 10*3/uL (ref 0.7–4.0)
MCH: 33.6 pg (ref 26.0–34.0)
MCHC: 35.1 g/dL (ref 30.0–36.0)
MCV: 95.6 fL (ref 80.0–100.0)
Monocytes Absolute: 0.6 10*3/uL (ref 0.1–1.0)
Monocytes Relative: 5 %
Neutro Abs: 9.4 10*3/uL — ABNORMAL HIGH (ref 1.7–7.7)
Neutrophils Relative %: 84 %
Platelets: 197 10*3/uL (ref 150–400)
RBC: 4.53 MIL/uL (ref 4.22–5.81)
RDW: 12.4 % (ref 11.5–15.5)
WBC: 11.3 10*3/uL — ABNORMAL HIGH (ref 4.0–10.5)
nRBC: 0 % (ref 0.0–0.2)

## 2024-03-16 MED ORDER — OXYCODONE-ACETAMINOPHEN 5-325 MG PO TABS
1.0000 | ORAL_TABLET | Freq: Once | ORAL | Status: AC
  Administered 2024-03-16: 1 via ORAL
  Filled 2024-03-16: qty 1

## 2024-03-16 MED ORDER — OXYCODONE-ACETAMINOPHEN 5-325 MG PO TABS: 1.0000 | ORAL_TABLET | Freq: Three times a day (TID) | ORAL | 0 refills | Status: AC | PRN

## 2024-03-16 NOTE — Progress Notes (Signed)
 Orthopedic Tech Progress Note Patient Details:  Mario KESTER Sr. May 18, 1951 [id number] Applied knee immobilizer per order.  Ortho Devices Type of Ortho Device: Knee Immobilizer Ortho Device/Splint Location: LLE Ortho Device/Splint Interventions: Ordered, Application, Adjustment   Post Interventions Patient Tolerated: Well Instructions Provided: Adjustment of device, Care of device  Rayna Calkin 03/16/2024, 9:52 PM

## 2024-03-16 NOTE — ED Provider Notes (Signed)
 [place] EMERGENCY DEPARTMENT AT [facility] Provider Note   CSN: [id number] Arrival date & time: 03/16/24  1737     History  Chief Complaint  Patient presents with   Mario Buba Sr. is a 73 y.o. male.  With a history of right quadricep tendon rupture status post repair who presents to ED for left knee pain.  Patient was out for a walk today and sat on the curb to take a rest he lost his balance and "twisted" the left knee and has had severe pain since that time.  The knee is held in flexion and he is unable to fully extend.  No other injuries were sustained.  Prior quadricep tendon repair done by Dr. Agatha Horsfall   Fall       Home Medications Prior to Admission medications   Medication Sig Start Date End Date Taking? Authorizing Provider  oxyCODONE-acetaminophen (PERCOCET/ROXICET) 5-325 MG tablet Take 1 tablet by mouth every 8 (eight) hours as needed for up to 3 days for severe pain (pain score 7-10). 03/16/24 03/19/24 Yes Sallyanne Creamer, DO  atorvastatin (LIPITOR) 40 MG tablet Take 40 mg by mouth at bedtime. 06/03/17   [provider]  celecoxib (CELEBREX) 200 MG capsule 1 capsule with food Orally Once a day for 30 days 07/27/22   [provider]  levothyroxine (SYNTHROID, LEVOTHROID) 50 MCG tablet Take 50 mcg by mouth daily. 07/27/17   [provider]  lisinopril (PRINIVIL,ZESTRIL) 20 MG tablet Take 20 mg by mouth daily. 05/29/17   [provider]  Magnesium 250 MG TABS Take 1 tablet by mouth once every other day Oral 06/12/10   [provider]  Melatonin 5 MG TABS Take 5 mg by mouth at bedtime.    [provider]  Multiple Vitamin (MULTIVITAMIN WITH MINERALS) TABS tablet Take 1 tablet by mouth daily. Patient not taking: Reported on 11/12/2022    [provider]  omeprazole (PRILOSEC) 20 MG capsule Take 20 mg by mouth daily. Patient not taking: Reported on 11/12/2022 08/09/17   [provider]   oxyCODONE (ROXICODONE) 5 MG immediate release tablet Take 1 tablet (5 mg total) by mouth every 4 (four) hours as needed for severe pain. Patient not taking: Reported on 11/12/2022 11/12/18   Osa Blase, MD  sertraline (ZOLOFT) 50 MG tablet Take 50 mg by mouth daily. 08/17/17   [provider]      Allergies    Patient has no known allergies.    Review of Systems   Review of Systems  Physical Exam Updated Vital Signs BP (!) 140/71 (BP Location: Left Arm)   Pulse 67   Temp 98.5 F (36.9 C) (Oral)   Resp 16   Ht 5\' 9"  (1.753 m)   Wt 85.7 kg   SpO2 97%   BMI 27.91 kg/m  Physical Exam Vitals and nursing note reviewed.  HENT:     Head: Normocephalic and atraumatic.  Eyes:     Pupils: Pupils are equal, round, and reactive to light.  Cardiovascular:     Rate and Rhythm: Normal rate and regular rhythm.  Pulmonary:     Effort: Pulmonary effort is normal.     Breath sounds: Normal breath sounds.  Abdominal:     Palpations: Abdomen is soft.     Tenderness: There is no abdominal tenderness.  Musculoskeletal:     Comments: Left knee held in flexion with tenderness over superior lateral aspect with associated swelling 4-5  motor strength throughout right lower extremity Motor strength testing limited in left lower extremity but full active range of motion with flexion at the left hip Sensation tact light touch throughout 2+ DP pulse bilaterally  Skin:    General: Skin is warm and dry.  Neurological:     Mental Status: He is alert.  Psychiatric:        Mood and Affect: Mood normal.     ED Results / Procedures / Treatments   Labs (all labs ordered are listed, but only abnormal results are displayed) Labs Reviewed  COMPREHENSIVE METABOLIC PANEL WITH GFR - Abnormal; Notable for the following components:      Result Value   Glucose, Bld 110 (*)    All other components within normal limits  CBC WITH DIFFERENTIAL/PLATELET - Abnormal; Notable for the following  components:   WBC 11.3 (*)    Neutro Abs 9.4 (*)    All other components within normal limits    EKG None  Radiology DG Knee Complete 4 Views Left Result Date: 03/16/2024 CLINICAL DATA:  fall EXAM: LEFT KNEE - COMPLETE 4+ VIEW COMPARISON:  None Available. FINDINGS: No evidence of fracture, dislocation, or joint effusion. No evidence of arthropathy or other focal bone abnormality. Soft tissues are unremarkable. Vascular calcification. IMPRESSION: Negative for acute traumatic injury. Limited evaluation due to nonstandard views. Electronically Signed   By: Tish Frederickson M.D.   On: 03/16/2024 20:19    Procedures Procedures    Medications Ordered in ED Medications  oxyCODONE-acetaminophen (PERCOCET/ROXICET) 5-325 MG per tablet 1 tablet (1 tablet Oral Given 03/16/24 1834)    ED Course/ Medical Decision Making/ A&P Clinical Course as of 03/16/24 2150  Thu Mar 16, 2024  2146 No osseous abnormality on the x-ray.  Patient now able to fully extend left lower extremity after relaxing and some pain medication.  Suspect this to be either ligamentous injury or muscle strain/sprain.  Will keep in knee immobilizer, provide with crutches and give a short course of Percocet to help with severe pain.  He will follow-up with his orthopedic surgeon [MP]    Clinical Course User Index [MP] Royanne Foots, DO                                 Medical Decision Making 73 year old male with history as above presenting for left knee injury.  Twisting injury after sitting down on a curb to take a rest.  Knee held in flexion unable to fully extend.  Pain well-tolerated with the knee held in flexion.  Tenderness over the superior lateral aspect of the left knee with swelling.  This does not appear to be patellar dislocation.  Most likely quadricep tendon rupture versus another ligamentous injury.  Will obtain plain films and reach out to orthopedics  Amount and/or Complexity of Data Reviewed Labs:  ordered. Radiology: ordered.  Risk Prescription drug management.           Final Clinical Impression(s) / ED Diagnoses Final diagnoses:  Acute pain of left knee    Rx / DC Orders ED Discharge Orders          Ordered    oxyCODONE-acetaminophen (PERCOCET/ROXICET) 5-325 MG tablet  Every 8 hours PRN        03/16/24 2150              Royanne Foots, DO 03/16/24 2150

## 2024-03-16 NOTE — Discharge Instructions (Signed)
 You were seen in the Emergency Department for left knee pain after an injury The bones look okay and there is no fracture or dislocation This is more likely a muscle strain, tendon injury or ligamentous injury For this reason we gave you a knee immobilizer and crutches You should leave these in place until seen by orthopedics Follow-up with Dr. Agatha Horsfall who was previously seen you for your right knee Pick of the prescribed Percocet from your pharmacy and begin taking as directed for severe pain only Do not drink alcohol or drive while taking Percocet Return to the emergency department for severe pain, if you are unable to walk or have any other concerns

## 2024-03-16 NOTE — ED Triage Notes (Signed)
 Patient had mechanical fall while on a walk today. Twisted left knee in the fall. Pain and deformity to same. No dizziness prior to fall. No LOC.

## 2024-03-17 DIAGNOSIS — S76112A Strain of left quadriceps muscle, fascia and tendon, initial encounter: Secondary | ICD-10-CM | POA: Diagnosis not present

## 2024-03-20 DIAGNOSIS — S76112D Strain of left quadriceps muscle, fascia and tendon, subsequent encounter: Secondary | ICD-10-CM | POA: Diagnosis not present

## 2024-03-23 DIAGNOSIS — G8918 Other acute postprocedural pain: Secondary | ICD-10-CM | POA: Diagnosis not present

## 2024-03-23 DIAGNOSIS — S76102A Unspecified injury of left quadriceps muscle, fascia and tendon, initial encounter: Secondary | ICD-10-CM | POA: Diagnosis not present

## 2024-03-23 DIAGNOSIS — M66852 Spontaneous rupture of other tendons, left thigh: Secondary | ICD-10-CM | POA: Diagnosis not present

## 2024-03-23 DIAGNOSIS — S83412A Sprain of medial collateral ligament of left knee, initial encounter: Secondary | ICD-10-CM | POA: Diagnosis not present

## 2024-03-23 DIAGNOSIS — S83422A Sprain of lateral collateral ligament of left knee, initial encounter: Secondary | ICD-10-CM | POA: Diagnosis not present

## 2024-04-07 DIAGNOSIS — S76102A Unspecified injury of left quadriceps muscle, fascia and tendon, initial encounter: Secondary | ICD-10-CM | POA: Diagnosis not present

## 2024-05-05 DIAGNOSIS — S76102D Unspecified injury of left quadriceps muscle, fascia and tendon, subsequent encounter: Secondary | ICD-10-CM | POA: Diagnosis not present

## 2024-05-11 DIAGNOSIS — M25662 Stiffness of left knee, not elsewhere classified: Secondary | ICD-10-CM | POA: Diagnosis not present

## 2024-05-11 DIAGNOSIS — M6281 Muscle weakness (generalized): Secondary | ICD-10-CM | POA: Diagnosis not present

## 2024-05-11 DIAGNOSIS — R2681 Unsteadiness on feet: Secondary | ICD-10-CM | POA: Diagnosis not present

## 2024-05-11 DIAGNOSIS — X58XXXD Exposure to other specified factors, subsequent encounter: Secondary | ICD-10-CM | POA: Diagnosis not present

## 2024-05-11 DIAGNOSIS — S76112D Strain of left quadriceps muscle, fascia and tendon, subsequent encounter: Secondary | ICD-10-CM | POA: Diagnosis not present

## 2024-05-15 DIAGNOSIS — X58XXXD Exposure to other specified factors, subsequent encounter: Secondary | ICD-10-CM | POA: Diagnosis not present

## 2024-05-15 DIAGNOSIS — R2681 Unsteadiness on feet: Secondary | ICD-10-CM | POA: Diagnosis not present

## 2024-05-15 DIAGNOSIS — S76112D Strain of left quadriceps muscle, fascia and tendon, subsequent encounter: Secondary | ICD-10-CM | POA: Diagnosis not present

## 2024-05-15 DIAGNOSIS — M25662 Stiffness of left knee, not elsewhere classified: Secondary | ICD-10-CM | POA: Diagnosis not present

## 2024-05-15 DIAGNOSIS — M6281 Muscle weakness (generalized): Secondary | ICD-10-CM | POA: Diagnosis not present

## 2024-05-18 DIAGNOSIS — X58XXXD Exposure to other specified factors, subsequent encounter: Secondary | ICD-10-CM | POA: Diagnosis not present

## 2024-05-18 DIAGNOSIS — M6281 Muscle weakness (generalized): Secondary | ICD-10-CM | POA: Diagnosis not present

## 2024-05-18 DIAGNOSIS — R2681 Unsteadiness on feet: Secondary | ICD-10-CM | POA: Diagnosis not present

## 2024-05-18 DIAGNOSIS — S76112D Strain of left quadriceps muscle, fascia and tendon, subsequent encounter: Secondary | ICD-10-CM | POA: Diagnosis not present

## 2024-05-18 DIAGNOSIS — M25662 Stiffness of left knee, not elsewhere classified: Secondary | ICD-10-CM | POA: Diagnosis not present

## 2024-05-25 DIAGNOSIS — M6281 Muscle weakness (generalized): Secondary | ICD-10-CM | POA: Diagnosis not present

## 2024-05-25 DIAGNOSIS — X58XXXD Exposure to other specified factors, subsequent encounter: Secondary | ICD-10-CM | POA: Diagnosis not present

## 2024-05-25 DIAGNOSIS — S76112D Strain of left quadriceps muscle, fascia and tendon, subsequent encounter: Secondary | ICD-10-CM | POA: Diagnosis not present

## 2024-05-25 DIAGNOSIS — R2681 Unsteadiness on feet: Secondary | ICD-10-CM | POA: Diagnosis not present

## 2024-05-25 DIAGNOSIS — M25662 Stiffness of left knee, not elsewhere classified: Secondary | ICD-10-CM | POA: Diagnosis not present

## 2024-06-07 DIAGNOSIS — X58XXXD Exposure to other specified factors, subsequent encounter: Secondary | ICD-10-CM | POA: Diagnosis not present

## 2024-06-07 DIAGNOSIS — M25662 Stiffness of left knee, not elsewhere classified: Secondary | ICD-10-CM | POA: Diagnosis not present

## 2024-06-07 DIAGNOSIS — S76112D Strain of left quadriceps muscle, fascia and tendon, subsequent encounter: Secondary | ICD-10-CM | POA: Diagnosis not present

## 2024-06-07 DIAGNOSIS — M6281 Muscle weakness (generalized): Secondary | ICD-10-CM | POA: Diagnosis not present

## 2024-06-07 DIAGNOSIS — R2681 Unsteadiness on feet: Secondary | ICD-10-CM | POA: Diagnosis not present

## 2024-06-09 DIAGNOSIS — S76102D Unspecified injury of left quadriceps muscle, fascia and tendon, subsequent encounter: Secondary | ICD-10-CM | POA: Diagnosis not present

## 2024-06-12 DIAGNOSIS — S76112D Strain of left quadriceps muscle, fascia and tendon, subsequent encounter: Secondary | ICD-10-CM | POA: Diagnosis not present

## 2024-06-12 DIAGNOSIS — R2681 Unsteadiness on feet: Secondary | ICD-10-CM | POA: Diagnosis not present

## 2024-06-12 DIAGNOSIS — X58XXXD Exposure to other specified factors, subsequent encounter: Secondary | ICD-10-CM | POA: Diagnosis not present

## 2024-06-12 DIAGNOSIS — M25662 Stiffness of left knee, not elsewhere classified: Secondary | ICD-10-CM | POA: Diagnosis not present

## 2024-06-12 DIAGNOSIS — M6281 Muscle weakness (generalized): Secondary | ICD-10-CM | POA: Diagnosis not present

## 2024-06-19 DIAGNOSIS — M25662 Stiffness of left knee, not elsewhere classified: Secondary | ICD-10-CM | POA: Diagnosis not present

## 2024-06-19 DIAGNOSIS — M6281 Muscle weakness (generalized): Secondary | ICD-10-CM | POA: Diagnosis not present

## 2024-06-19 DIAGNOSIS — X58XXXD Exposure to other specified factors, subsequent encounter: Secondary | ICD-10-CM | POA: Diagnosis not present

## 2024-06-19 DIAGNOSIS — S76112D Strain of left quadriceps muscle, fascia and tendon, subsequent encounter: Secondary | ICD-10-CM | POA: Diagnosis not present

## 2024-06-19 DIAGNOSIS — R2681 Unsteadiness on feet: Secondary | ICD-10-CM | POA: Diagnosis not present

## 2024-06-26 DIAGNOSIS — M6281 Muscle weakness (generalized): Secondary | ICD-10-CM | POA: Diagnosis not present

## 2024-06-26 DIAGNOSIS — S76112D Strain of left quadriceps muscle, fascia and tendon, subsequent encounter: Secondary | ICD-10-CM | POA: Diagnosis not present

## 2024-06-26 DIAGNOSIS — R2681 Unsteadiness on feet: Secondary | ICD-10-CM | POA: Diagnosis not present

## 2024-06-26 DIAGNOSIS — X58XXXD Exposure to other specified factors, subsequent encounter: Secondary | ICD-10-CM | POA: Diagnosis not present

## 2024-06-26 DIAGNOSIS — M25662 Stiffness of left knee, not elsewhere classified: Secondary | ICD-10-CM | POA: Diagnosis not present

## 2024-07-04 DIAGNOSIS — R2681 Unsteadiness on feet: Secondary | ICD-10-CM | POA: Diagnosis not present

## 2024-07-04 DIAGNOSIS — M6281 Muscle weakness (generalized): Secondary | ICD-10-CM | POA: Diagnosis not present

## 2024-07-04 DIAGNOSIS — M25662 Stiffness of left knee, not elsewhere classified: Secondary | ICD-10-CM | POA: Diagnosis not present

## 2024-07-04 DIAGNOSIS — S76112D Strain of left quadriceps muscle, fascia and tendon, subsequent encounter: Secondary | ICD-10-CM | POA: Diagnosis not present

## 2024-07-04 DIAGNOSIS — X58XXXD Exposure to other specified factors, subsequent encounter: Secondary | ICD-10-CM | POA: Diagnosis not present

## 2024-07-11 DIAGNOSIS — X58XXXD Exposure to other specified factors, subsequent encounter: Secondary | ICD-10-CM | POA: Diagnosis not present

## 2024-07-11 DIAGNOSIS — R2681 Unsteadiness on feet: Secondary | ICD-10-CM | POA: Diagnosis not present

## 2024-07-11 DIAGNOSIS — M6281 Muscle weakness (generalized): Secondary | ICD-10-CM | POA: Diagnosis not present

## 2024-07-11 DIAGNOSIS — S76112D Strain of left quadriceps muscle, fascia and tendon, subsequent encounter: Secondary | ICD-10-CM | POA: Diagnosis not present

## 2024-07-11 DIAGNOSIS — M25662 Stiffness of left knee, not elsewhere classified: Secondary | ICD-10-CM | POA: Diagnosis not present

## 2024-07-18 DIAGNOSIS — R2681 Unsteadiness on feet: Secondary | ICD-10-CM | POA: Diagnosis not present

## 2024-07-18 DIAGNOSIS — M25662 Stiffness of left knee, not elsewhere classified: Secondary | ICD-10-CM | POA: Diagnosis not present

## 2024-07-18 DIAGNOSIS — M6281 Muscle weakness (generalized): Secondary | ICD-10-CM | POA: Diagnosis not present

## 2024-07-18 DIAGNOSIS — S76112D Strain of left quadriceps muscle, fascia and tendon, subsequent encounter: Secondary | ICD-10-CM | POA: Diagnosis not present

## 2024-07-18 DIAGNOSIS — X58XXXD Exposure to other specified factors, subsequent encounter: Secondary | ICD-10-CM | POA: Diagnosis not present

## 2024-07-21 DIAGNOSIS — S76102D Unspecified injury of left quadriceps muscle, fascia and tendon, subsequent encounter: Secondary | ICD-10-CM | POA: Diagnosis not present

## 2024-07-25 DIAGNOSIS — X58XXXD Exposure to other specified factors, subsequent encounter: Secondary | ICD-10-CM | POA: Diagnosis not present

## 2024-07-25 DIAGNOSIS — R2681 Unsteadiness on feet: Secondary | ICD-10-CM | POA: Diagnosis not present

## 2024-07-25 DIAGNOSIS — M6281 Muscle weakness (generalized): Secondary | ICD-10-CM | POA: Diagnosis not present

## 2024-07-25 DIAGNOSIS — S76112D Strain of left quadriceps muscle, fascia and tendon, subsequent encounter: Secondary | ICD-10-CM | POA: Diagnosis not present

## 2024-07-25 DIAGNOSIS — M25662 Stiffness of left knee, not elsewhere classified: Secondary | ICD-10-CM | POA: Diagnosis not present

## 2024-08-01 DIAGNOSIS — X58XXXD Exposure to other specified factors, subsequent encounter: Secondary | ICD-10-CM | POA: Diagnosis not present

## 2024-08-01 DIAGNOSIS — M6281 Muscle weakness (generalized): Secondary | ICD-10-CM | POA: Diagnosis not present

## 2024-08-01 DIAGNOSIS — M25662 Stiffness of left knee, not elsewhere classified: Secondary | ICD-10-CM | POA: Diagnosis not present

## 2024-08-01 DIAGNOSIS — R2681 Unsteadiness on feet: Secondary | ICD-10-CM | POA: Diagnosis not present

## 2024-08-01 DIAGNOSIS — S76112D Strain of left quadriceps muscle, fascia and tendon, subsequent encounter: Secondary | ICD-10-CM | POA: Diagnosis not present

## 2024-08-13 ENCOUNTER — Emergency Department (HOSPITAL_BASED_OUTPATIENT_CLINIC_OR_DEPARTMENT_OTHER): Admitting: Radiology

## 2024-08-13 ENCOUNTER — Emergency Department (HOSPITAL_BASED_OUTPATIENT_CLINIC_OR_DEPARTMENT_OTHER)
Admission: EM | Admit: 2024-08-13 | Discharge: 2024-08-13 | Disposition: A | Discharge status: Home / Self Care | Attending: Emergency Medicine | Admitting: Emergency Medicine

## 2024-08-13 ENCOUNTER — Encounter (HOSPITAL_BASED_OUTPATIENT_CLINIC_OR_DEPARTMENT_OTHER): Payer: Self-pay

## 2024-08-13 ENCOUNTER — Other Ambulatory Visit: Payer: Self-pay

## 2024-08-13 ENCOUNTER — Emergency Department (HOSPITAL_BASED_OUTPATIENT_CLINIC_OR_DEPARTMENT_OTHER)

## 2024-08-13 DIAGNOSIS — R220 Localized swelling, mass and lump, head: Secondary | ICD-10-CM | POA: Diagnosis not present

## 2024-08-13 DIAGNOSIS — M25561 Pain in right knee: Secondary | ICD-10-CM | POA: Diagnosis not present

## 2024-08-13 DIAGNOSIS — S50311A Abrasion of right elbow, initial encounter: Secondary | ICD-10-CM | POA: Insufficient documentation

## 2024-08-13 DIAGNOSIS — S0081XA Abrasion of other part of head, initial encounter: Secondary | ICD-10-CM | POA: Diagnosis not present

## 2024-08-13 DIAGNOSIS — S80211A Abrasion, right knee, initial encounter: Secondary | ICD-10-CM | POA: Diagnosis not present

## 2024-08-13 DIAGNOSIS — G44309 Post-traumatic headache, unspecified, not intractable: Secondary | ICD-10-CM | POA: Diagnosis not present

## 2024-08-13 DIAGNOSIS — S0093XA Contusion of unspecified part of head, initial encounter: Secondary | ICD-10-CM | POA: Diagnosis not present

## 2024-08-13 DIAGNOSIS — W19XXXA Unspecified fall, initial encounter: Secondary | ICD-10-CM

## 2024-08-13 DIAGNOSIS — S0993XA Unspecified injury of face, initial encounter: Secondary | ICD-10-CM | POA: Diagnosis not present

## 2024-08-13 DIAGNOSIS — T148XXA Other injury of unspecified body region, initial encounter: Secondary | ICD-10-CM

## 2024-08-13 DIAGNOSIS — Y9301 Activity, walking, marching and hiking: Secondary | ICD-10-CM | POA: Insufficient documentation

## 2024-08-13 DIAGNOSIS — W01198A Fall on same level from slipping, tripping and stumbling with subsequent striking against other object, initial encounter: Secondary | ICD-10-CM | POA: Insufficient documentation

## 2024-08-13 MED ORDER — BACITRACIN ZINC 500 UNIT/GM EX OINT
TOPICAL_OINTMENT | Freq: Two times a day (BID) | CUTANEOUS | Status: DC
  Administered 2024-08-13: 31.5 via TOPICAL
  Filled 2024-08-13: qty 28.35

## 2024-08-13 NOTE — Discharge Instructions (Addendum)
 It was a pleasure taking care of you here today  Make sure to keep your wounds clean, dry  Keep close watch on your symptoms  Return for any worsening symptoms

## 2024-08-13 NOTE — ED Triage Notes (Signed)
 Pt advises he was walking, tripped over vine, face-first into pavement. Pt w skin tears to R elbow, R knee & abrasion to head.   Pt advises my feelings are hurt more than anything. Daughter advises lots of yawning, states that vision c/o blurred vision, now resolved.

## 2024-08-13 NOTE — ED Provider Notes (Signed)
 [place] [facility]ERGENC[facility] DEPARTMENT AT [facility] Provider Note   CSN: [id number] Arrival date & time: 08/13/24  1900    Patient presents with: Mario Norleen GORMAN [name] is a 73 y.o. male here for eval mechanical fall.  Tri[place].  Landed on right knee, hit his face on the ground.  Initially felt off for about a minute or so.  No nausea, vomiting, diplopia, neck pain, chest pain, shortness of breath, abd pain, LOC, any coagulation.  Ambulatory here.   HPI     Prior to Admission medications   Medication Sig Start Date End Date Taking? Authorizing Provider  atorvastatin  (LIPITOR) 40 MG tablet Take 40 mg by mouth at bedtime. 06/03/17   [provider]  celecoxib (CELEBREX) 200 MG capsule 1 capsule with food Orally Once a day for 30 days 07/27/22   [provider]  levothyroxine  (SYNTHROID , LEVOTHROID) 50 MCG tablet Take 50 mcg by mouth daily. 07/27/17   [provider]  lisinopril (PRINIVIL,ZESTRIL) 20 MG tablet Take 20 mg by mouth daily. 05/29/17   [provider]  Magnesium 250 MG TABS Take 1 tablet by mouth once every other day Oral 06/12/10   [provider]  Melatonin 5 MG TABS Take 5 mg by mouth at bedtime.    [provider]  Multiple Vitamin (MULTIVITAMIN WITH MINERALS) TABS tablet Take 1 tablet by mouth daily. Patient not taking: Reported on 11/12/2022    [provider]  omeprazole (PRILOSEC) 20 MG capsule Take 20 mg by mouth daily. Patient not taking: Reported on 11/12/2022 08/09/17   [provider]  oxyCODONE  (ROXICODONE ) 5 MG immediate release tablet Take 1 tablet (5 mg total) by mouth every 4 (four) hours as needed for severe pain. Patient not taking: Reported on 11/12/2022 11/12/18   Josefina Chew, MD  sertraline  (ZOLOFT ) 50 MG tablet Take 50 mg by mouth daily. 08/17/17   [provider]    Allergies: Patient has no known allergies.    Review of  Systems  Constitutional: Negative.   HENT: Negative.    Respiratory: Negative.    Cardiovascular: Negative.   Gastrointestinal: Negative.   Genitourinary: Negative.   Musculoskeletal: Negative.   Skin:        Abrasion forehead, right knee, right elbow  Neurological: Negative.   All other systems reviewed and are negative.   Updated Vital Signs BP 122/75 (BP Location: Right Arm)   Pulse 78   Temp 98 F (36.7 C)   Resp 17   SpO2 98%   Physical Exam Vitals and nursing note reviewed.  Constitutional:      General: He is not in acute distress.    Appearance: He is well-developed. He is not ill-appearing, toxic-appearing or diaphoretic.  HENT:     Head: Normocephalic.     Comments: No raccoon eyes, Battle sign.  3 cm abrasion right forehead with some with overlying soft tissue swelling.  No lacerations to suture Eyes:     Pupils: Pupils are equal, round, and reactive to light.     Comments: PERRLA, full range of motion  Neck:     Comments: No midline tenderness Cardiovascular:     Rate and Rhythm: Normal rate and regular rhythm.     Pulses: Normal pulses.          Radial pulses are 2+ on the right side and 2+ on the left side.     Heart sounds: Normal  heart sounds.  Pulmonary:     Effort: Pulmonary effort is normal. No respiratory distress.     Breath sounds: Normal breath sounds and air entry.     Comments: Clear bilaterally, speaks in full sentences without difficulty Abdominal:     General: There is no distension.     Palpations: Abdomen is soft.  Musculoskeletal:        General: Normal range of motion.     Cervical back: Full passive range of motion without pain, normal range of motion and neck supple.     Comments: No midline C/T/L tenderness.  Nontender upper lower extremities  Skin:    General: Skin is warm and dry.     Capillary Refill: Capillary refill takes less than 2 seconds.     Comments: Abrasion right knee, forehead, right elbow  Neurological:      General: No focal deficit present.     Mental Status: He is alert and oriented to person, place, and time.     (all labs ordered are listed, but only abnormal results are displayed) Labs Reviewed - No data to display  EKG: None  Radiology: DG Knee Complete 4 Views Right Result Date: 08/13/2024 CLINICAL DATA:  Recent fall with right knee pain, initial encounter EXAM: RIGHT KNEE - COMPLETE 4+ VIEW COMPARISON:  None Available. FINDINGS: No evidence of fracture, dislocation, or joint effusion. No evidence of arthropathy or other focal bone abnormality. Soft tissues are unremarkable. IMPRESSION: No acute abnormality noted. Electronically Signed   By: Oneil Devonshire M.D.   On: 08/13/2024 20:00   CT Head Wo Contrast Result Date: 08/13/2024 CLINICAL DATA:  Recent trip and fall with headaches and facial pain, initial encounter EXAM: CT HEAD WITHOUT CONTRAST CT MAXILLOFACIAL WITHOUT CONTRAST CT CERVICAL SPINE WITHOUT CONTRAST TECHNIQUE: Multidetector CT imaging of the head, cervical spine, and maxillofacial structures were performed using the standard protocol without intravenous contrast. Multiplanar CT image reconstructions of the cervical spine and maxillofacial structures were also generated. RADIATION DOSE REDUCTION: This exam was performed according to the departmental dose-optimization program which includes automated exposure control, adjustment of the mA and/or kV according to patient size and/or use of iterative reconstruction technique. COMPARISON:  None Available. FINDINGS: CT HEAD FINDINGS Brain: No evidence of acute infarction, hemorrhage, hydrocephalus, extra-axial collection or mass lesion/mass effect. Chronic atrophic changes are noted. Vascular: No hyperdense vessel or unexpected calcification. Skull: Normal. Negative for fracture or focal lesion. Other: Mild soft tissue swelling in the right forehead is noted. CT MAXILLOFACIAL FINDINGS Osseous: No fracture or mandibular dislocation. No  destructive process. Orbits: Negative. No traumatic or inflammatory finding. Sinuses: Clear. Soft tissues: Mild soft tissue swelling is noted in the right forehead similar to that seen on recent head CT. No other soft tissue abnormality is noted. CT CERVICAL SPINE FINDINGS Alignment: Mild anterolisthesis of C4 on C5 of a degenerative nature is noted. Skull base and vertebrae: 7 cervical segments are well visualized. Vertebral body height is well maintained. No acute fracture or acute facet abnormality is noted. Multilevel osteophytic change and facet hypertrophic changes are noted. The odontoid is within normal limits. Soft tissues and spinal canal: Surrounding soft tissue structures are within normal limits. Upper chest: Visualized lung apices are unremarkable. Other: None IMPRESSION: CT of the head: Chronic atrophic changes without acute abnormality. CT of the facial bones: No acute bony abnormality noted. Mild forehead soft tissue swelling on the right. CT of the cervical spine: Multilevel degenerative change without acute abnormality. Electronically Signed  By: Oneil Devonshire M.D.   On: 08/13/2024 19:59   CT Cervical Spine Wo Contrast Result Date: 08/13/2024 CLINICAL DATA:  Recent trip and fall with headaches and facial pain, initial encounter EXAM: CT HEAD WITHOUT CONTRAST CT MAXILLOFACIAL WITHOUT CONTRAST CT CERVICAL SPINE WITHOUT CONTRAST TECHNIQUE: Multidetector CT imaging of the head, cervical spine, and maxillofacial structures were performed using the standard protocol without intravenous contrast. Multiplanar CT image reconstructions of the cervical spine and maxillofacial structures were also generated. RADIATION DOSE REDUCTION: This exam was performed according to the departmental dose-optimization program which includes automated exposure control, adjustment of the mA and/or kV according to patient size and/or use of iterative reconstruction technique. COMPARISON:  None Available. FINDINGS: CT HEAD  FINDINGS Brain: No evidence of acute infarction, hemorrhage, hydrocephalus, extra-axial collection or mass lesion/mass effect. Chronic atrophic changes are noted. Vascular: No hyperdense vessel or unexpected calcification. Skull: Normal. Negative for fracture or focal lesion. Other: Mild soft tissue swelling in the right forehead is noted. CT MAXILLOFACIAL FINDINGS Osseous: No fracture or mandibular dislocation. No destructive process. Orbits: Negative. No traumatic or inflammatory finding. Sinuses: Clear. Soft tissues: Mild soft tissue swelling is noted in the right forehead similar to that seen on recent head CT. No other soft tissue abnormality is noted. CT CERVICAL SPINE FINDINGS Alignment: Mild anterolisthesis of C4 on C5 of a degenerative nature is noted. Skull base and vertebrae: 7 cervical segments are well visualized. Vertebral body height is well maintained. No acute fracture or acute facet abnormality is noted. Multilevel osteophytic change and facet hypertrophic changes are noted. The odontoid is within normal limits. Soft tissues and spinal canal: Surrounding soft tissue structures are within normal limits. Upper chest: Visualized lung apices are unremarkable. Other: None IMPRESSION: CT of the head: Chronic atrophic changes without acute abnormality. CT of the facial bones: No acute bony abnormality noted. Mild forehead soft tissue swelling on the right. CT of the cervical spine: Multilevel degenerative change without acute abnormality. Electronically Signed   By: Oneil Devonshire M.D.   On: 08/13/2024 19:59   CT Maxillofacial Wo Contrast Result Date: 08/13/2024 CLINICAL DATA:  Recent trip and fall with headaches and facial pain, initial encounter EXAM: CT HEAD WITHOUT CONTRAST CT MAXILLOFACIAL WITHOUT CONTRAST CT CERVICAL SPINE WITHOUT CONTRAST TECHNIQUE: Multidetector CT imaging of the head, cervical spine, and maxillofacial structures were performed using the standard protocol without intravenous  contrast. Multiplanar CT image reconstructions of the cervical spine and maxillofacial structures were also generated. RADIATION DOSE REDUCTION: This exam was performed according to the departmental dose-optimization program which includes automated exposure control, adjustment of the mA and/or kV according to patient size and/or use of iterative reconstruction technique. COMPARISON:  None Available. FINDINGS: CT HEAD FINDINGS Brain: No evidence of acute infarction, hemorrhage, hydrocephalus, extra-axial collection or mass lesion/mass effect. Chronic atrophic changes are noted. Vascular: No hyperdense vessel or unexpected calcification. Skull: Normal. Negative for fracture or focal lesion. Other: Mild soft tissue swelling in the right forehead is noted. CT MAXILLOFACIAL FINDINGS Osseous: No fracture or mandibular dislocation. No destructive process. Orbits: Negative. No traumatic or inflammatory finding. Sinuses: Clear. Soft tissues: Mild soft tissue swelling is noted in the right forehead similar to that seen on recent head CT. No other soft tissue abnormality is noted. CT CERVICAL SPINE FINDINGS Alignment: Mild anterolisthesis of C4 on C5 of a degenerative nature is noted. Skull base and vertebrae: 7 cervical segments are well visualized. Vertebral body height is well maintained. No acute fracture or acute facet abnormality  is noted. Multilevel osteophytic change and facet hypertrophic changes are noted. The odontoid is within normal limits. Soft tissues and spinal canal: Surrounding soft tissue structures are within normal limits. Upper chest: Visualized lung apices are unremarkable. Other: None IMPRESSION: CT of the head: Chronic atrophic changes without acute abnormality. CT of the facial bones: No acute bony abnormality noted. Mild forehead soft tissue swelling on the right. CT of the cervical spine: Multilevel degenerative change without acute abnormality. Electronically Signed   By: Oneil Devonshire M.D.   On:  08/13/2024 19:59     Procedures   Medications Ordered in the ED  bacitracin  ointment (31.5 Applications Topical Given 08/13/24 5774)   73 year old here for evaluation after mechanical trip and fall.  He has abrasion to his forehead, right knee, right elbow.  No bony tenderness.  No midline C/T/L tenderness.  Felt additionally dazed after the incident however symptoms improved.  Here he is ambulatory.  He is neurovascularly intact.  His tetanus is up-to-date.  Will clean his wounds, get imaging, bacitracin   Imaging personally viewed interpreted:  No significant abnormality  Discussed results with patient.  Wounds cleaned, bacitracin  applied.  Will have him follow-up outpatient, return for any worsening symptoms or worsening symptoms  The patient has been appropriately medically screened and/or stabilized in the ED. I have low suspicion for any other emergent medical condition which would require further screening, evaluation or treatment in the ED or require inpatient management.  Patient is hemodynamically stable and in no acute distress.  Patient able to ambulate in department prior to ED.  Evaluation does not show acute pathology that would require ongoing or additional emergent interventions while in the emergency department or further inpatient treatment.  I have discussed the diagnosis with the patient and answered all questions.  Pain is been managed while in the emergency department and patient has no further complaints prior to discharge.  Patient is comfortable with plan discussed in room and is stable for discharge at this time.  I have discussed strict return precautions for returning to the emergency department.  Patient was encouraged to follow-up with PCP/specialist refer to at discharge.                                    Medical Decision Making Amount and/or Complexity of Data Reviewed Independent Historian: spouse External Data Reviewed: labs, radiology and  notes. Radiology: ordered and independent interpretation performed. Decision-making details documented in ED Course.  Risk OTC drugs. Decision regarding hospitalization. Diagnosis or treatment significantly limited by social determinants of health.        Final diagnoses:  Fall, initial encounter  Abrasion    ED Discharge Orders     None          Audrina Marten A, PA-C 08/13/24 2251    Ruthe Cornet, DO 08/13/24 2307
